# Patient Record
Sex: Female | Born: 1960 | Race: White | Hispanic: No | Marital: Married | Smoking: Never smoker
Health system: Southern US, Community
[De-identification: ages and names within clinical notes are randomized; demographics above are authoritative.]

## PROBLEM LIST (undated history)

## (undated) DIAGNOSIS — R000 Tachycardia, unspecified: Secondary | ICD-10-CM

## (undated) DIAGNOSIS — Z8249 Family history of ischemic heart disease and other diseases of the circulatory system: Secondary | ICD-10-CM

## (undated) DIAGNOSIS — R011 Cardiac murmur, unspecified: Secondary | ICD-10-CM

## (undated) DIAGNOSIS — E785 Hyperlipidemia, unspecified: Secondary | ICD-10-CM

## (undated) DIAGNOSIS — Z803 Family history of malignant neoplasm of breast: Secondary | ICD-10-CM

## (undated) DIAGNOSIS — R0789 Other chest pain: Secondary | ICD-10-CM

## (undated) DIAGNOSIS — Z780 Asymptomatic menopausal state: Secondary | ICD-10-CM

## (undated) DIAGNOSIS — Z8041 Family history of malignant neoplasm of ovary: Secondary | ICD-10-CM

## (undated) DIAGNOSIS — N912 Amenorrhea, unspecified: Secondary | ICD-10-CM

## (undated) DIAGNOSIS — M8580 Other specified disorders of bone density and structure, unspecified site: Secondary | ICD-10-CM

## (undated) HISTORY — DX: Other chest pain: R07.89

## (undated) HISTORY — DX: Asymptomatic menopausal state: Z78.0

## (undated) HISTORY — DX: Family history of malignant neoplasm of breast: Z80.3

## (undated) HISTORY — DX: Family history of ischemic heart disease and other diseases of the circulatory system: Z82.49

## (undated) HISTORY — DX: Family history of malignant neoplasm of ovary: Z80.41

## (undated) HISTORY — DX: Amenorrhea, unspecified: N91.2

## (undated) HISTORY — DX: Tachycardia, unspecified: R00.0

## (undated) HISTORY — DX: Cardiac murmur, unspecified: R01.1

## (undated) HISTORY — DX: Other specified disorders of bone density and structure, unspecified site: M85.80

## (undated) HISTORY — DX: Hyperlipidemia, unspecified: E78.5

---

## 1999-09-02 ENCOUNTER — Other Ambulatory Visit: Admission: RE | Admit: 1999-09-02 | Discharge: 1999-09-02 | Payer: Self-pay | Admitting: Obstetrics and Gynecology

## 1999-09-11 ENCOUNTER — Ambulatory Visit (HOSPITAL_COMMUNITY): Admission: RE | Admit: 1999-09-11 | Discharge: 1999-09-11 | Payer: Self-pay | Admitting: Obstetrics and Gynecology

## 2000-09-29 ENCOUNTER — Other Ambulatory Visit: Admission: RE | Admit: 2000-09-29 | Discharge: 2000-09-29 | Payer: Self-pay | Admitting: Obstetrics and Gynecology

## 2002-04-06 ENCOUNTER — Other Ambulatory Visit: Admission: RE | Admit: 2002-04-06 | Discharge: 2002-04-06 | Payer: Self-pay | Admitting: Obstetrics and Gynecology

## 2003-04-28 ENCOUNTER — Other Ambulatory Visit: Admission: RE | Admit: 2003-04-28 | Discharge: 2003-04-28 | Payer: Self-pay | Admitting: Obstetrics and Gynecology

## 2004-01-11 ENCOUNTER — Other Ambulatory Visit: Admission: RE | Admit: 2004-01-11 | Discharge: 2004-01-11 | Payer: Self-pay | Admitting: Obstetrics and Gynecology

## 2004-07-26 ENCOUNTER — Inpatient Hospital Stay (HOSPITAL_COMMUNITY): Admission: AD | Admit: 2004-07-26 | Discharge: 2004-08-01 | Payer: Self-pay | Admitting: Obstetrics and Gynecology

## 2004-09-03 ENCOUNTER — Other Ambulatory Visit: Admission: RE | Admit: 2004-09-03 | Discharge: 2004-09-03 | Payer: Self-pay | Admitting: Obstetrics and Gynecology

## 2005-12-03 ENCOUNTER — Other Ambulatory Visit: Admission: RE | Admit: 2005-12-03 | Discharge: 2005-12-03 | Payer: Self-pay | Admitting: Obstetrics and Gynecology

## 2012-11-11 ENCOUNTER — Ambulatory Visit: Payer: Self-pay | Admitting: Cardiovascular Disease

## 2012-12-15 ENCOUNTER — Ambulatory Visit: Payer: Self-pay | Admitting: Cardiovascular Disease

## 2012-12-31 ENCOUNTER — Ambulatory Visit: Payer: Self-pay | Admitting: Cardiovascular Disease

## 2013-01-14 ENCOUNTER — Ambulatory Visit: Payer: Self-pay | Admitting: Cardiovascular Disease

## 2013-01-31 ENCOUNTER — Encounter: Payer: Self-pay | Admitting: *Deleted

## 2013-02-02 ENCOUNTER — Ambulatory Visit: Payer: Self-pay | Admitting: Cardiovascular Disease

## 2013-03-03 ENCOUNTER — Ambulatory Visit (INDEPENDENT_AMBULATORY_CARE_PROVIDER_SITE_OTHER): Payer: Self-pay | Admitting: Cardiovascular Disease

## 2013-03-03 ENCOUNTER — Encounter: Payer: Self-pay | Admitting: *Deleted

## 2013-03-03 ENCOUNTER — Encounter: Payer: Self-pay | Admitting: Cardiovascular Disease

## 2013-03-03 VITALS — BP 122/83 | HR 75 | Ht 61.0 in | Wt 148.0 lb

## 2013-03-03 DIAGNOSIS — R000 Tachycardia, unspecified: Secondary | ICD-10-CM

## 2013-03-03 DIAGNOSIS — N912 Amenorrhea, unspecified: Secondary | ICD-10-CM | POA: Insufficient documentation

## 2013-03-03 DIAGNOSIS — R011 Cardiac murmur, unspecified: Secondary | ICD-10-CM | POA: Insufficient documentation

## 2013-03-03 DIAGNOSIS — E785 Hyperlipidemia, unspecified: Secondary | ICD-10-CM

## 2013-03-03 NOTE — Patient Instructions (Signed)
Your physician recommends that you schedule a follow-up appointment in: AS NEEDED  Your physician recommends that you continue on your current medications as directed. Please refer to the Current Medication list given to you today.  

## 2013-03-03 NOTE — Assessment & Plan Note (Signed)
Benign palpitations with no evidence of structural heart disease Observe

## 2013-03-03 NOTE — Progress Notes (Signed)
Patient ID: Wendy Shaw, female   DOB: 1961-10-26, 52 y.o.   MRN: [id number] 52 y o referred by Dr Arelia Sneddon for palpitations, murmur and elevated lipids. Family history of CAD.  Just started on statin for LDL 178 Labs reveiwed.  Palpitations at night infrequent.  Can get 1/week and then not at all for weeks. Short lived less than a minute. Usually resting watching tv. No ppt and no presyncope chest pain or dyspnea. Long standing history of benign murmur.  Last echo over 15 years ago ok.  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  All other systems reviewed and negative   General: Affect appropriate Healthy:  appears stated age HEENT: normal Neck supple with no adenopathy JVP normal no bruits no thyromegaly Lungs clear with no wheezing and good diaphragmatic motion Heart:  S1/S2 1/6 beingn SEM  murmur,rub, gallop or click PMI normal Abdomen: benighn, BS positve, no tenderness, no AAA no bruit.  No HSM or HJR Distal pulses intact with no bruits No edema Neuro non-focal Skin warm and dry No muscular weakness  Medications Current Outpatient Prescriptions  Medication Sig Dispense Refill  . CALCIUM-VITAMIN D PO Take 2 tablets by mouth daily.      . Multiple Vitamin (MULTIVITAMIN) capsule Take 1 capsule by mouth daily.      . Omega-3 Fatty Acids (FISH OIL PO) Take 2 tablets by mouth daily.       No current facility-administered medications for this visit.    Allergies Review of patient's allergies indicates no known allergies.  Family History: No family history on file.  Social History: History   Social History  . Marital Status: Single    Spouse Name: N/A    Number of Children: N/A  . Years  of Education: N/A   Occupational History  . Not on file.   Social History Main Topics  . Smoking status: Never Smoker   . Smokeless tobacco: Not on file  . Alcohol Use: Yes  . Drug Use: No  . Sexually Active: Not on file   Other Topics Concern  . Not on file   Social History Narrative  . No narrative on file    Electrocardiogram:  NSR rate 75 normal ECG  Assessment and Plan

## 2013-03-03 NOTE — Assessment & Plan Note (Signed)
Continue statin F/U labs with [facility]

## 2013-03-03 NOTE — Assessment & Plan Note (Signed)
Benign NO indication for echo or SBE

## 2017-07-17 DIAGNOSIS — E7800 Pure hypercholesterolemia, unspecified: Secondary | ICD-10-CM | POA: Diagnosis not present

## 2017-12-14 DIAGNOSIS — H5213 Myopia, bilateral: Secondary | ICD-10-CM | POA: Diagnosis not present

## 2017-12-24 DIAGNOSIS — Z8042 Family history of malignant neoplasm of prostate: Secondary | ICD-10-CM | POA: Diagnosis not present

## 2017-12-24 DIAGNOSIS — Z801 Family history of malignant neoplasm of trachea, bronchus and lung: Secondary | ICD-10-CM | POA: Diagnosis not present

## 2017-12-24 DIAGNOSIS — Z803 Family history of malignant neoplasm of breast: Secondary | ICD-10-CM | POA: Diagnosis not present

## 2017-12-24 DIAGNOSIS — Z01419 Encounter for gynecological examination (general) (routine) without abnormal findings: Secondary | ICD-10-CM | POA: Diagnosis not present

## 2018-01-26 DIAGNOSIS — H40012 Open angle with borderline findings, low risk, left eye: Secondary | ICD-10-CM | POA: Diagnosis not present

## 2018-01-26 DIAGNOSIS — H5213 Myopia, bilateral: Secondary | ICD-10-CM | POA: Diagnosis not present

## 2018-01-26 DIAGNOSIS — H524 Presbyopia: Secondary | ICD-10-CM | POA: Diagnosis not present

## 2018-02-01 ENCOUNTER — Encounter: Payer: Self-pay | Admitting: Nurse Practitioner

## 2018-02-01 ENCOUNTER — Ambulatory Visit: Payer: 59 | Admitting: Nurse Practitioner

## 2018-02-01 VITALS — BP 142/88 | HR 75 | Temp 97.8°F | Ht 61.0 in | Wt 152.0 lb

## 2018-02-01 DIAGNOSIS — Z1159 Encounter for screening for other viral diseases: Secondary | ICD-10-CM | POA: Diagnosis not present

## 2018-02-01 DIAGNOSIS — Z87898 Personal history of other specified conditions: Secondary | ICD-10-CM

## 2018-02-01 DIAGNOSIS — E782 Mixed hyperlipidemia: Secondary | ICD-10-CM | POA: Diagnosis not present

## 2018-02-01 DIAGNOSIS — Z0001 Encounter for general adult medical examination with abnormal findings: Secondary | ICD-10-CM

## 2018-02-01 MED ORDER — MECLIZINE HCL 12.5 MG PO TABS: 12.5000 mg | ORAL_TABLET | Freq: Two times a day (BID) | ORAL | 0 refills | Status: DC | PRN

## 2018-02-01 NOTE — Patient Instructions (Addendum)
Please call your insurance about hepatitis c and cologuard coverage.  Let me know through mychart if you want to proceed with hepatitis C and cologuard orders.  Return to lab for blood draw (need to be fasting at least 6-8hrs prior to blood).  Use meclizine as needed for vertigo. Do not drive or operate heavy machinery if have vertigo and need medication.  Check BP at home or retail pharmacy 2-3times a week. Call office if BP persistent >150/80.  Encourage regular exercise and heart healthy diet.  Health Maintenance, Female Adopting a healthy lifestyle and getting preventive care can go a long way to promote health and wellness. Talk with your health care provider about what schedule of regular examinations is right for you. This is a good chance for you to check in with your provider about disease prevention and staying healthy. In between checkups, there are plenty of things you can do on your own. Experts have done a lot of research about which lifestyle changes and preventive measures are most likely to keep you healthy. Ask your health care provider for more information. Weight and diet Eat a healthy diet  Be sure to include plenty of vegetables, fruits, low-fat dairy products, and lean protein.  Do not eat a lot of foods high in solid fats, added sugars, or salt.  Get regular exercise. This is one of the most important things you can do for your health. ? Most adults should exercise for at least 150 minutes each week. The exercise should increase your heart rate and make you sweat (moderate-intensity exercise). ? Most adults should also do strengthening exercises at least twice a week. This is in addition to the moderate-intensity exercise.  Maintain a healthy weight  Body mass index (BMI) is a measurement that can be used to identify possible weight problems. It estimates body fat based on height and weight. Your health care provider can help determine your BMI and help you  achieve or maintain a healthy weight.  For females 70 years of age and older: ? A BMI below 18.5 is considered underweight. ? A BMI of 18.5 to 24.9 is normal. ? A BMI of 25 to 29.9 is considered overweight. ? A BMI of 30 and above is considered obese.  Watch levels of cholesterol and blood lipids  You should start having your blood tested for lipids and cholesterol at 56 years of age, then have this test every 5 years.  You may need to have your cholesterol levels checked more often if: ? Your lipid or cholesterol levels are high. ? You are older than 56 years of age. ? You are at high risk for heart disease.  Cancer screening Lung Cancer  Lung cancer screening is recommended for adults 60-52 years old who are at high risk for lung cancer because of a history of smoking.  A yearly low-dose CT scan of the lungs is recommended for people who: ? Currently smoke. ? Have quit within the past 15 years. ? Have at least a 30-pack-year history of smoking. A pack year is smoking an average of one pack of cigarettes a day for 1 year.  Yearly screening should continue until it has been 15 years since you quit.  Yearly screening should stop if you develop a health problem that would prevent you from having lung cancer treatment.  Breast Cancer  Practice breast self-awareness. This means understanding how your breasts normally appear and feel.  It also means doing regular breast self-exams. Let your  health care provider know about any changes, no matter how small.  If you are in your 20s or 30s, you should have a clinical breast exam (CBE) by a health care provider every 1-3 years as part of a regular health exam.  If you are 75 or older, have a CBE every year. Also consider having a breast X-ray (mammogram) every year.  If you have a family history of breast cancer, talk to your health care provider about genetic screening.  If you are at high risk for breast cancer, talk to your health  care provider about having an MRI and a mammogram every year.  Breast cancer gene (BRCA) assessment is recommended for women who have family members with BRCA-related cancers. BRCA-related cancers include: ? Breast. ? Ovarian. ? Tubal. ? Peritoneal cancers.  Results of the assessment will determine the need for genetic counseling and BRCA1 and BRCA2 testing.  Cervical Cancer Your health care provider may recommend that you be screened regularly for cancer of the pelvic organs (ovaries, uterus, and vagina). This screening involves a pelvic examination, including checking for microscopic changes to the surface of your cervix (Pap test). You may be encouraged to have this screening done every 3 years, beginning at age 66.  For women ages 39-65, health care providers may recommend pelvic exams and Pap testing every 3 years, or they may recommend the Pap and pelvic exam, combined with testing for human papilloma virus (HPV), every 5 years. Some types of HPV increase your risk of cervical cancer. Testing for HPV may also be done on women of any age with unclear Pap test results.  Other health care providers may not recommend any screening for nonpregnant women who are considered low risk for pelvic cancer and who do not have symptoms. Ask your health care provider if a screening pelvic exam is right for you.  If you have had past treatment for cervical cancer or a condition that could lead to cancer, you need Pap tests and screening for cancer for at least 20 years after your treatment. If Pap tests have been discontinued, your risk factors (such as having a new sexual partner) need to be reassessed to determine if screening should resume. Some women have medical problems that increase the chance of getting cervical cancer. In these cases, your health care provider may recommend more frequent screening and Pap tests.  Colorectal Cancer  This type of cancer can be detected and often  prevented.  Routine colorectal cancer screening usually begins at 56 years of age and continues through 56 years of age.  Your health care provider may recommend screening at an earlier age if you have risk factors for colon cancer.  Your health care provider may also recommend using home test kits to check for hidden blood in the stool.  A small camera at the end of a tube can be used to examine your colon directly (sigmoidoscopy or colonoscopy). This is done to check for the earliest forms of colorectal cancer.  Routine screening usually begins at age 75.  Direct examination of the colon should be repeated every 5-10 years through 56 years of age. However, you may need to be screened more often if early forms of precancerous polyps or small growths are found.  Skin Cancer  Check your skin from head to toe regularly.  Tell your health care provider about any new moles or changes in moles, especially if there is a change in a mole's shape or color.  Also  tell your health care provider if you have a mole that is larger than the size of a pencil eraser.  Always use sunscreen. Apply sunscreen liberally and repeatedly throughout the day.  Protect yourself by wearing long sleeves, pants, a wide-brimmed hat, and sunglasses whenever you are outside.  Heart disease, diabetes, and high blood pressure  High blood pressure causes heart disease and increases the risk of stroke. High blood pressure is more likely to develop in: ? People who have blood pressure in the high end of the normal range (130-139/85-89 mm Hg). ? People who are overweight or obese. ? People who are African American.  If you are 70-88 years of age, have your blood pressure checked every 3-5 years. If you are 70 years of age or older, have your blood pressure checked every year. You should have your blood pressure measured twice-once when you are at a hospital or clinic, and once when you are not at a hospital or clinic.  Record the average of the two measurements. To check your blood pressure when you are not at a hospital or clinic, you can use: ? An automated blood pressure machine at a pharmacy. ? A home blood pressure monitor.  If you are between 31 years and 20 years old, ask your health care provider if you should take aspirin to prevent strokes.  Have regular diabetes screenings. This involves taking a blood sample to check your fasting blood sugar level. ? If you are at a normal weight and have a low risk for diabetes, have this test once every three years after 56 years of age. ? If you are overweight and have a high risk for diabetes, consider being tested at a younger age or more often. Preventing infection Hepatitis B  If you have a higher risk for hepatitis B, you should be screened for this virus. You are considered at high risk for hepatitis B if: ? You were born in a country where hepatitis B is common. Ask your health care provider which countries are considered high risk. ? Your parents were born in a high-risk country, and you have not been immunized against hepatitis B (hepatitis B vaccine). ? You have HIV or AIDS. ? You use needles to inject street drugs. ? You live with someone who has hepatitis B. ? You have had sex with someone who has hepatitis B. ? You get hemodialysis treatment. ? You take certain medicines for conditions, including cancer, organ transplantation, and autoimmune conditions.  Hepatitis C  Blood testing is recommended for: ? Everyone born from [birth year] through 1965. ? Anyone with known risk factors for hepatitis C.  Sexually transmitted infections (STIs)  You should be screened for sexually transmitted infections (STIs) including gonorrhea and chlamydia if: ? You are sexually active and are younger than 56 years of age. ? You are older than 56 years of age and your health care provider tells you that you are at risk for this type of infection. ? Your sexual  activity has changed since you were last screened and you are at an increased risk for chlamydia or gonorrhea. Ask your health care provider if you are at risk.  If you do not have HIV, but are at risk, it may be recommended that you take a prescription medicine daily to prevent HIV infection. This is called pre-exposure prophylaxis (PrEP). You are considered at risk if: ? You are sexually active and do not regularly use condoms or know the HIV status of your  partner(s). ? You take drugs by injection. ? You are sexually active with a partner who has HIV.  Talk with your health care provider about whether you are at high risk of being infected with HIV. If you choose to begin PrEP, you should first be tested for HIV. You should then be tested every 3 months for as long as you are taking PrEP. Pregnancy  If you are premenopausal and you may become pregnant, ask your health care provider about preconception counseling.  If you may become pregnant, take 400 to 800 micrograms (mcg) of folic acid every day.  If you want to prevent pregnancy, talk to your health care provider about birth control (contraception). Osteoporosis and menopause  Osteoporosis is a disease in which the bones lose minerals and strength with aging. This can result in serious bone fractures. Your risk for osteoporosis can be identified using a bone density scan.  If you are 100 years of age or older, or if you are at risk for osteoporosis and fractures, ask your health care provider if you should be screened.  Ask your health care provider whether you should take a calcium or vitamin D supplement to lower your risk for osteoporosis.  Menopause may have certain physical symptoms and risks.  Hormone replacement therapy may reduce some of these symptoms and risks. Talk to your health care provider about whether hormone replacement therapy is right for you. Follow these instructions at home:  Schedule regular health, dental,  and eye exams.  Stay current with your immunizations.  Do not use any tobacco products including cigarettes, chewing tobacco, or electronic cigarettes.  If you are pregnant, do not drink alcohol.  If you are breastfeeding, limit how much and how often you drink alcohol.  Limit alcohol intake to no more than 1 drink per day for nonpregnant women. One drink equals 12 ounces of beer, 5 ounces of wine, or 1 ounces of hard liquor.  Do not use street drugs.  Do not share needles.  Ask your health care provider for help if you need support or information about quitting drugs.  Tell your health care provider if you often feel depressed.  Tell your health care provider if you have ever been abused or do not feel safe at home. This information is not intended to replace advice given to you by your health care provider. Make sure you discuss any questions you have with your health care provider. Document Released: 06/09/2011 Document Revised: 05/01/2016 Document Reviewed: 08/28/2015 Elsevier Interactive Patient Education  [employer].

## 2018-02-01 NOTE — Progress Notes (Signed)
Subjective:    Patient ID: Wendy Shaw, female    DOB: 09/09/1961, 57 y.o.   MRN: [id number]  Patient presents today for complete physical  Dizziness  This is a chronic problem. The current episode started more than 1 year ago. The problem occurs intermittently. The problem has been unchanged. Associated symptoms include vertigo. Pertinent negatives include no chest pain, congestion, coughing, fever, headaches, myalgias, neck pain, numbness, rash, sore throat, visual change or weakness. The symptoms are aggravated by twisting. She has tried rest for the symptoms. The treatment provided significant relief.   Hx or Vertigo" Intermittent,  Describes as spinning and unsteady gait. Will like meclizine Rx  Previous pcp with [facility]: Dr. Zachery DauerBarnes. Last seen 07/2017.  Immunizations: (TDAP, Hep C screen, Pneumovax, Influenza, zoster)  Health Maintenance  Topic Date Due  .  Hepatitis C: One time screening is recommended by Center for Disease Control  (CDC) for  adults born from [birth year] through 1965.   September 20, 1961  . Tetanus Vaccine  12/09/1980  . Pap Smear  12/09/1982  . Mammogram  12/10/2011  . Colon Cancer Screening  12/10/2011  . Flu Shot  10/04/2018*  . HIV Screening  02/01/2019*  *Topic was postponed. The date shown is not the original due date.   Diet:regular.  Weight:  Wt Readings from Last 3 Encounters:  02/01/18 152 lb (68.9 kg)  03/03/13 148 lb (67.1 kg)    Exercise:none.  Fall Risk: Fall Risk  02/01/2018  Falls in the past year? No   Home Safety:home with husband.  Depression/Suicide: Depression screen [facility] 2/9 02/01/2018  Decreased Interest 0  Down, Depressed, Hopeless 0  PHQ - 2 Score 0   No flowsheet data found. Colonoscopy (every 5-3548yrs, >50-29328yrs):needed  Pap Smear (every 4098yrs for >21-29 without HPV, every 3328yrs for >30-81628yrs with HPV):Dr. Mccomb [facility]. Last seen 12/2017 (normal PAP), no hx of abnormal.  Mammogram (yearly,  >12428yrs):last done 12/2017 with normal results per patient.  Vision:up to date.  Dental:up to date.  Medications and allergies reviewed with patient and updated if appropriate.  Patient Active Problem List   Diagnosis Date Noted  . Hyperlipidemia 03/03/2013  . Heart murmur   . Tachycardia   . Amenorrhea     Current Outpatient Medications on File Prior to Visit  Medication Sig Dispense Refill  . CALCIUM-VITAMIN D PO Take 1,000 mg by mouth daily. 2 tab daily,Calcium, magnesium & Zinc    . Glucosamine Sulfate 1000 MG TABS Take 1,000 mg by mouth daily at 2 PM. 2 tab daily    . Multiple Vitamin (MULTIVITAMIN) capsule Take 1 capsule by mouth daily.    . Omega-3 Fatty Acids (FISH OIL PO) Take 1,000 mg by mouth daily at 2 PM. 2 tab daily    . rosuvastatin (CRESTOR) 5 MG tablet TK 1 T PO QD  0  . Specialty Vitamins Products (BIOTIN PLUS KERATIN PO) Take 100 mg by mouth daily at 2 PM.      No current facility-administered medications on file prior to visit.     Past Medical History:  Diagnosis Date  . Amenorrhea    secondary to menopause  . Heart murmur   . Hyperlipidemia   . Tachycardia     Past Surgical History:  Procedure Laterality Date  . CESAREAN SECTION  2005    Social History   Socioeconomic History  . Marital status: Single    Spouse name: None  . Number of children: None  . Years  of education: None  . Highest education level: None  Social Needs  . Financial resource strain: None  . Food insecurity - worry: None  . Food insecurity - inability: None  . Transportation needs - medical: None  . Transportation needs - non-medical: None  Occupational History  . None  Tobacco Use  . Smoking status: Never Smoker  . Smokeless tobacco: Never Used  Substance and Sexual Activity  . Alcohol use: Yes    Comment: social  . Drug use: No  . Sexual activity: None  Other Topics Concern  . None  Social History Narrative  . None    Family History  Problem Relation  Age of Onset  . Hyperlipidemia Mother   . Arthritis Mother   . Cancer Father        prostate cancer  . Arthritis Father   . Hypertension Father   . Heart attack Father   . Cancer Maternal Grandfather        lung cancer        Review of Systems  Constitutional: Negative for fever, malaise/fatigue and weight loss.  HENT: Negative for congestion and sore throat.   Eyes:       Negative for visual changes  Respiratory: Negative for cough and shortness of breath.   Cardiovascular: Negative for chest pain, palpitations and leg swelling.  Gastrointestinal: Negative for blood in stool, constipation, diarrhea and heartburn.  Genitourinary: Negative for dysuria, frequency and urgency.  Musculoskeletal: Negative for falls, joint pain, myalgias and neck pain.  Skin: Negative for rash.  Neurological: Positive for dizziness and vertigo. Negative for sensory change, weakness, numbness and headaches.  Endo/Heme/Allergies: Does not bruise/bleed easily.  Psychiatric/Behavioral: Negative for depression, substance abuse and suicidal ideas. The patient is not nervous/anxious.     Objective:   Vitals:   02/01/18 0914  BP: (!) 142/88  Pulse: 75  Temp: 97.8 F (36.6 C)  SpO2: 97%    Body mass index is 28.72 kg/m.   Physical Examination:  Physical Exam  Constitutional: She is oriented to person, place, and time and well-developed, well-nourished, and in no distress. No distress.  HENT:  Right Ear: External ear normal.  Left Ear: External ear normal.  Nose: Nose normal.  Mouth/Throat: Oropharynx is clear and moist. No oropharyngeal exudate.  Eyes: Conjunctivae and EOM are normal. Pupils are equal, round, and reactive to light. No scleral icterus.  Neck: Normal range of motion. Neck supple. No thyromegaly present.  Cardiovascular: Normal rate, normal heart sounds and intact distal pulses.  Pulmonary/Chest: Effort normal and breath sounds normal. She exhibits no tenderness.  Abdominal:  Soft. Bowel sounds are normal. She exhibits no distension. There is no tenderness.  Genitourinary:  Genitourinary Comments: Pelvic and breast exam deferred to GYN per patient  Musculoskeletal: Normal range of motion. She exhibits no edema or tenderness.  Lymphadenopathy:    She has no cervical adenopathy.  Neurological: She is alert and oriented to person, place, and time. Gait normal.  Skin: Skin is warm and dry.  Psychiatric: Affect and judgment normal.    ASSESSMENT and PLAN:  Cordelia Pen was seen today for establish care.  Diagnoses and all orders for this visit:  Encounter for preventative adult health care exam with abnormal findings -     CBC; Future -     Comprehensive metabolic panel; Future -     Hepatitis C Antibody; Future  Mixed hyperlipidemia -     Lipid panel; Future  Encounter for hepatitis C screening test  for low risk patient -     Hepatitis C Antibody; Future  Hx of vertigo -     meclizine (ANTIVERT) 12.5 MG tablet; Take 1 tablet (12.5 mg total) by mouth 2 (two) times daily as needed for dizziness (do not use for more than 3days).   No problem-specific Assessment & Plan notes found for this encounter.     Follow up: Return in about 6 months (around 08/01/2018) for hyperlipidemia (fasting).  Alysia Penna, NP

## 2018-02-03 ENCOUNTER — Encounter: Payer: Self-pay | Admitting: Nurse Practitioner

## 2018-02-04 ENCOUNTER — Telehealth: Payer: Self-pay | Admitting: Nurse Practitioner

## 2018-02-04 NOTE — Telephone Encounter (Signed)
Hep C test canceled.

## 2018-02-04 NOTE — Addendum Note (Signed)
Addended by: Marcell AngerSELF, Nasirah Sachs E on: 02/04/2018 04:24 PM   Modules accepted: Orders

## 2018-02-04 NOTE — Telephone Encounter (Signed)
Copied from CRM [phone]. Topic: Quick Communication - See Telephone Encounter >> Feb 04, 2018  4:19 PM Trula SladeWalter, Linda F wrote: CRM for notification. See Telephone encounter for:  02/04/18. Patient is having blood work done tomorrow 02/05/18, but she does not want the Hep C test.  Just do the standard blood test.

## 2018-02-05 ENCOUNTER — Telehealth: Payer: Self-pay | Admitting: Nurse Practitioner

## 2018-02-05 ENCOUNTER — Other Ambulatory Visit (INDEPENDENT_AMBULATORY_CARE_PROVIDER_SITE_OTHER): Payer: 59

## 2018-02-05 DIAGNOSIS — E782 Mixed hyperlipidemia: Secondary | ICD-10-CM

## 2018-02-05 DIAGNOSIS — Z0001 Encounter for general adult medical examination with abnormal findings: Secondary | ICD-10-CM | POA: Diagnosis not present

## 2018-02-05 LAB — COMPREHENSIVE METABOLIC PANEL
ALT: 17 U/L (ref 0–35)
AST: 16 U/L (ref 0–37)
Albumin: 3.9 g/dL (ref 3.5–5.2)
Alkaline Phosphatase: 83 U/L (ref 39–117)
BILIRUBIN TOTAL: 0.5 mg/dL (ref 0.2–1.2)
BUN: 12 mg/dL (ref 6–23)
CHLORIDE: 105 meq/L (ref 96–112)
CO2: 32 mEq/L (ref 19–32)
CREATININE: 0.47 mg/dL (ref 0.40–1.20)
Calcium: 9.5 mg/dL (ref 8.4–10.5)
GFR: 145.61 mL/min (ref >60.00)
Glucose, Bld: 98 mg/dL (ref 70–99)
Potassium: 4.8 mEq/L (ref 3.5–5.1)
Sodium: 145 mEq/L (ref 135–145)
Total Protein: 7.2 g/dL (ref 6.0–8.3)

## 2018-02-05 LAB — LIPID PANEL
CHOLESTEROL: 236 mg/dL — AB (ref 0–200)
HDL: 81 mg/dL (ref >39.00)
LDL CALC: 144 mg/dL — AB (ref 0–99)
NonHDL: 154.94
TRIGLYCERIDES: 53 mg/dL (ref 0.0–149.0)
Total CHOL/HDL Ratio: 3
VLDL: 10.6 mg/dL (ref 0.0–40.0)

## 2018-02-05 LAB — CBC
HEMATOCRIT: 41.9 % (ref 36.0–46.0)
Hemoglobin: 13.9 g/dL (ref 12.0–15.0)
MCHC: 33.1 g/dL (ref 30.0–36.0)
MCV: 89 fl (ref 78.0–100.0)
Platelets: 289 10*3/uL (ref 150.0–400.0)
RBC: 4.71 Mil/uL (ref 3.87–5.11)
RDW: 13.3 % (ref 11.5–15.5)
WBC: 6.2 10*3/uL (ref 4.0–10.5)

## 2018-02-05 MED ORDER — ROSUVASTATIN CALCIUM 5 MG PO TABS: ORAL_TABLET | ORAL | 1 refills | Status: DC

## 2018-02-05 NOTE — Telephone Encounter (Signed)
Left detail massage inform the pt we sent in rx as requested.

## 2018-02-05 NOTE — Telephone Encounter (Signed)
Ok to sent 90tabs with 1refill

## 2018-02-05 NOTE — Telephone Encounter (Signed)
Please advise, lab is not result yet.

## 2018-02-05 NOTE — Telephone Encounter (Signed)
-----   Message from Varney BilesAngela M Wiesner, RT sent at 02/05/2018  9:17 AM EST ----- Regarding: Cholesterol med Pt just came in for labs. She wants to know if her prescription for Crestor [facility]. She will be out by this Sunday. Thanks

## 2018-02-10 ENCOUNTER — Encounter: Payer: Self-pay | Admitting: Nurse Practitioner

## 2018-05-17 DIAGNOSIS — Z1212 Encounter for screening for malignant neoplasm of rectum: Secondary | ICD-10-CM | POA: Diagnosis not present

## 2018-05-17 DIAGNOSIS — Z1211 Encounter for screening for malignant neoplasm of colon: Secondary | ICD-10-CM | POA: Diagnosis not present

## 2018-05-18 LAB — COLOGUARD: Cologuard: NEGATIVE

## 2018-07-18 ENCOUNTER — Other Ambulatory Visit: Payer: Self-pay | Admitting: Nurse Practitioner

## 2019-02-09 DIAGNOSIS — Z6830 Body mass index (BMI) 30.0-30.9, adult: Secondary | ICD-10-CM | POA: Diagnosis not present

## 2019-02-09 DIAGNOSIS — Z01419 Encounter for gynecological examination (general) (routine) without abnormal findings: Secondary | ICD-10-CM | POA: Diagnosis not present

## 2019-02-25 ENCOUNTER — Telehealth: Payer: Self-pay | Admitting: Nurse Practitioner

## 2019-02-25 NOTE — Telephone Encounter (Signed)
Copied from CRM [id number]. Topic: Quick Communication - Rx Refill/Question >> Feb 25, 2019  3:48 PM Jaquita Rector A wrote: Medication: rosuvastatin (CRESTOR) 5 MG tablet [  Has the patient contacted their pharmacy? Yes.   (Agent: If no, request that the patient contact the pharmacy for the refill.) (Agent: If yes, when and what did the pharmacy advise?)  Preferred Pharmacy (with phone number or street name): [id number] - [place], [place] - [address] AT [facility] [address] [phone] (Phone) [phone] (Fax)    Agent: Please be advised that RX refills may take up to 3 business days. We ask that you follow-up with your pharmacy.

## 2019-02-28 MED ORDER — ROSUVASTATIN CALCIUM 5 MG PO TABS: ORAL_TABLET | ORAL | 1 refills | Status: DC

## 2019-02-28 NOTE — Telephone Encounter (Signed)
Rx sent,left detail massage inform the pt and remind the pt to make 6 mo f/u with [facility].

## 2019-03-25 ENCOUNTER — Telehealth: Payer: Self-pay | Admitting: Nurse Practitioner

## 2019-03-25 NOTE — Telephone Encounter (Signed)
Called and left vm for patient. Calling to schedule virtual visit with [place].  °

## 2019-08-25 ENCOUNTER — Ambulatory Visit: Payer: 59 | Admitting: Nurse Practitioner

## 2019-10-10 ENCOUNTER — Ambulatory Visit (INDEPENDENT_AMBULATORY_CARE_PROVIDER_SITE_OTHER): Payer: 59 | Admitting: Nurse Practitioner

## 2019-10-10 ENCOUNTER — Other Ambulatory Visit: Payer: Self-pay

## 2019-10-10 ENCOUNTER — Encounter: Payer: Self-pay | Admitting: Nurse Practitioner

## 2019-10-10 VITALS — BP 136/86 | HR 74 | Temp 97.6°F | Ht 61.0 in | Wt 161.2 lb

## 2019-10-10 DIAGNOSIS — E782 Mixed hyperlipidemia: Secondary | ICD-10-CM

## 2019-10-10 DIAGNOSIS — Z114 Encounter for screening for human immunodeficiency virus [HIV]: Secondary | ICD-10-CM

## 2019-10-10 DIAGNOSIS — Z0000 Encounter for general adult medical examination without abnormal findings: Secondary | ICD-10-CM | POA: Diagnosis not present

## 2019-10-10 DIAGNOSIS — Z1159 Encounter for screening for other viral diseases: Secondary | ICD-10-CM | POA: Diagnosis not present

## 2019-10-10 DIAGNOSIS — Z23 Encounter for immunization: Secondary | ICD-10-CM

## 2019-10-10 LAB — TSH: TSH: 1.41 u[IU]/mL (ref 0.35–4.50)

## 2019-10-10 LAB — CBC
HCT: 41.9 % (ref 36.0–46.0)
Hemoglobin: 13.7 g/dL (ref 12.0–15.0)
MCHC: 32.7 g/dL (ref 30.0–36.0)
MCV: 89.6 fl (ref 78.0–100.0)
Platelets: 299 10*3/uL (ref 150.0–400.0)
RBC: 4.68 Mil/uL (ref 3.87–5.11)
RDW: 13.9 % (ref 11.5–15.5)
WBC: 5.3 10*3/uL (ref 4.0–10.5)

## 2019-10-10 LAB — COMPREHENSIVE METABOLIC PANEL
ALT: 16 U/L (ref 0–35)
AST: 16 U/L (ref 0–37)
Albumin: 4.1 g/dL (ref 3.5–5.2)
Alkaline Phosphatase: 88 U/L (ref 39–117)
BUN: 14 mg/dL (ref 6–23)
CO2: 30 mEq/L (ref 19–32)
Calcium: 9 mg/dL (ref 8.4–10.5)
Chloride: 104 mEq/L (ref 96–112)
Creatinine, Ser: 0.5 mg/dL (ref 0.40–1.20)
GFR: 126.8 mL/min (ref >60.00)
Glucose, Bld: 91 mg/dL (ref 70–99)
Potassium: 4.1 mEq/L (ref 3.5–5.1)
Sodium: 142 mEq/L (ref 135–145)
Total Bilirubin: 0.5 mg/dL (ref 0.2–1.2)
Total Protein: 6.8 g/dL (ref 6.0–8.3)

## 2019-10-10 LAB — LIPID PANEL
Cholesterol: 233 mg/dL — ABNORMAL HIGH (ref 0–200)
HDL: 72.7 mg/dL (ref >39.00)
LDL Cholesterol: 148 mg/dL — ABNORMAL HIGH (ref 0–99)
NonHDL: 160.39
Total CHOL/HDL Ratio: 3
Triglycerides: 63 mg/dL (ref 0.0–149.0)
VLDL: 12.6 mg/dL (ref 0.0–40.0)

## 2019-10-10 NOTE — Patient Instructions (Addendum)
Sign medical release to get records from [facility] (mammogram, PAP, and cologuard results)  Negative Hep.C and HIV Normal TSH, CMP, and CBC Abnormal lipid panel: elevated TC and LDL. increased crestor to 10mg . Also maintain DASH diet and regular exercise. F/up in 36months fo repeat lipid panel (fasting)  Return to office in 10months for second dose of Shingrix   Health Maintenance, Female Adopting a healthy lifestyle and getting preventive care are important in promoting health and wellness. Ask your health care provider about:  The right schedule for you to have regular tests and exams.  Things you can do on your own to prevent diseases and keep yourself healthy. What should I know about diet, weight, and exercise? Eat a healthy diet   Eat a diet that includes plenty of vegetables, fruits, low-fat dairy products, and lean protein.  Do not eat a lot of foods that are high in solid fats, added sugars, or sodium. Maintain a healthy weight Body mass index (BMI) is used to identify weight problems. It estimates body fat based on height and weight. Your health care provider can help determine your BMI and help you achieve or maintain a healthy weight. Get regular exercise Get regular exercise. This is one of the most important things you can do for your health. Most adults should:  Exercise for at least 150 minutes each week. The exercise should increase your heart rate and make you sweat (moderate-intensity exercise).  Do strengthening exercises at least twice a week. This is in addition to the moderate-intensity exercise.  Spend less time sitting. Even light physical activity can be beneficial. Watch cholesterol and blood lipids Have your blood tested for lipids and cholesterol at 57 years of age, then have this test every 5 years. Have your cholesterol levels checked more often if:  Your lipid or cholesterol levels are high.  You are older than 57 years of age.  You are  at high risk for heart disease. What should I know about cancer screening? Depending on your health history and family history, you may need to have cancer screening at various ages. This may include screening for:  Breast cancer.  Cervical cancer.  Colorectal cancer.  Skin cancer.  Lung cancer. What should I know about heart disease, diabetes, and high blood pressure? Blood pressure and heart disease  High blood pressure causes heart disease and increases the risk of stroke. This is more likely to develop in people who have high blood pressure readings, are of African descent, or are overweight.  Have your blood pressure checked: ? Every 3-5 years if you are 17-40 years of age. ? Every year if you are 76 years old or older. Diabetes Have regular diabetes screenings. This checks your fasting blood sugar level. Have the screening done:  Once every three years after age 27 if you are at a normal weight and have a low risk for diabetes.  More often and at a younger age if you are overweight or have a high risk for diabetes. What should I know about preventing infection? Hepatitis B If you have a higher risk for hepatitis B, you should be screened for this virus. Talk with your health care provider to find out if you are at risk for hepatitis B infection. Hepatitis C Testing is recommended for:  Everyone born from [birth year] through 1965.  Anyone with known risk factors for hepatitis C. Sexually transmitted infections (STIs)  Get screened for STIs, including gonorrhea and chlamydia, if: ? You  are sexually active and are younger than 57 years of age. ? You are older than 57 years of age and your health care provider tells you that you are at risk for this type of infection. ? Your sexual activity has changed since you were last screened, and you are at increased risk for chlamydia or gonorrhea. Ask your health care provider if you are at risk.  Ask your health care provider about  whether you are at high risk for HIV. Your health care provider may recommend a prescription medicine to help prevent HIV infection. If you choose to take medicine to prevent HIV, you should first get tested for HIV. You should then be tested every 3 months for as long as you are taking the medicine. Pregnancy  If you are about to stop having your period (premenopausal) and you may become pregnant, seek counseling before you get pregnant.  Take 400 to 800 micrograms (mcg) of folic acid every day if you become pregnant.  Ask for birth control (contraception) if you want to prevent pregnancy. Osteoporosis and menopause Osteoporosis is a disease in which the bones lose minerals and strength with aging. This can result in bone fractures. If you are 13 years old or older, or if you are at risk for osteoporosis and fractures, ask your health care provider if you should:  Be screened for bone loss.  Take a calcium or vitamin D supplement to lower your risk of fractures.  Be given hormone replacement therapy (HRT) to treat symptoms of menopause. Follow these instructions at home: Lifestyle  Do not use any products that contain nicotine or tobacco, such as cigarettes, e-cigarettes, and chewing tobacco. If you need help quitting, ask your health care provider.  Do not use street drugs.  Do not share needles.  Ask your health care provider for help if you need support or information about quitting drugs. Alcohol use  Do not drink alcohol if: ? Your health care provider tells you not to drink. ? You are pregnant, may be pregnant, or are planning to become pregnant.  If you drink alcohol: ? Limit how much you use to 0-1 drink a day. ? Limit intake if you are breastfeeding.  Be aware of how much alcohol is in your drink. In the [place] one drink equals one 12 oz bottle of beer (355 mL), one 5 oz glass of wine (148 mL), or one 1 oz glass of hard liquor (44 mL). General instructions  Schedule  regular health, dental, and eye exams.  Stay current with your vaccines.  Tell your health care provider if: ? You often feel depressed. ? You have ever been abused or do not feel safe at home. Summary  Adopting a healthy lifestyle and getting preventive care are important in promoting health and wellness.  Follow your health care provider's instructions about healthy diet, exercising, and getting tested or screened for diseases.  Follow your health care provider's instructions on monitoring your cholesterol and blood pressure. This information is not intended to replace advice given to you by your health care provider. Make sure you discuss any questions you have with your health care provider. Document Released: 06/09/2011 Document Revised: 11/17/2018 Document Reviewed: 11/17/2018 Elsevier Patient Education  2020 [employer].

## 2019-10-10 NOTE — Progress Notes (Addendum)
Subjective:    Patient ID: Wendy Shaw, female    DOB: 07/29/1961, 58 y.o.   MRN: [id number]  Patient presents today for complete physical   HPI  Sexual History (orientation,birth control, marital status, STD):PAP, mammogram, and breast exam [facility] by [facility].  Depression/Suicide: [facility] 10/10/2019 02/01/2018  Decreased Interest 0 0  Down, Depressed, Hopeless 0 0  PHQ - 2 Score 0 0   Vision:up to date  Dental:up to date, cleaning at least every 6months  Immunizations: (TDAP, Hep C screen, Pneumovax, Influenza, zoster): cologuard completed 2019 per patient: normal results Health Maintenance  Topic Date Due  .  Hepatitis C: One time screening is recommended by Center for Disease Control  (CDC) for  adults born from [birth year] through 1964.   [birth year]  . HIV Screening  12/09/1976  . Tetanus Vaccine  12/09/1980  . Pap Smear  12/09/1982  . Mammogram  12/10/2011  . Colon Cancer Screening  12/10/2011  . Flu Shot  Completed   Diet:regular.   Weight:  Wt Readings from Last 3 Encounters:  10/10/19 161 lb 3.2 oz (73.1 kg)  02/01/18 152 lb (68.9 kg)  03/03/13 148 lb (67.1 kg)    Exercise:none  Fall Risk: Fall Risk  10/10/2019 02/01/2018  Falls in the past year? 0 No   Medications and allergies reviewed with patient and updated if appropriate.  Patient Active Problem List   Diagnosis Date Noted  . Hyperlipidemia 03/03/2013  . Heart murmur   . Tachycardia   . Amenorrhea     Current Outpatient Medications on File Prior to Visit  Medication Sig Dispense Refill  . CALCIUM-VITAMIN D PO Take 1,000 mg by mouth daily. 2 tab daily,Calcium, magnesium & Zinc    . Glucosamine Sulfate 1000 MG TABS Take 1,000 mg by mouth daily at 2 PM. 2 tab daily    . IBUPROFEN PO Take by mouth.    . meclizine (ANTIVERT) 12.5 MG tablet Take 1 tablet (12.5 mg total) by mouth 2 (two) times daily as needed for dizziness (do not use for more than 3days). 6 tablet 0  . Multiple  Vitamin (MULTIVITAMIN) capsule Take 1 capsule by mouth daily.    Marland Kitchen. Specialty Vitamins Products (BIOTIN PLUS KERATIN PO) Take 100 mg by mouth daily at 2 PM.     . Omega-3 Fatty Acids (FISH OIL PO) Take 1,000 mg by mouth daily at 2 PM. 2 tab daily     No current facility-administered medications on file prior to visit.     Past Medical History:  Diagnosis Date  . Amenorrhea    secondary to menopause  . Heart murmur   . Hyperlipidemia   . Tachycardia     Past Surgical History:  Procedure Laterality Date  . CESAREAN SECTION  2005    Social History   Socioeconomic History  . Marital status: Married    Spouse name: Not on file  . Number of children: 2  . Years of education: Not on file  . Highest education level: Not on file  Occupational History    Comment: Housewife  Social Needs  . Financial resource strain: Not on file  . Food insecurity    Worry: Not on file    Inability: Not on file  . Transportation needs    Medical: Not on file    Non-medical: Not on file  Tobacco Use  . Smoking status: Never Smoker  . Smokeless tobacco: Never Used  Substance and Sexual Activity  .  Alcohol use: Yes    Comment: social  . Drug use: No  . Sexual activity: Yes    Birth control/protection: Post-menopausal  Lifestyle  . Physical activity    Days per week: Not on file    Minutes per session: Not on file  . Stress: Not on file  Relationships  . Social Herbalist on phone: Not on file    Gets together: Not on file    Attends religious service: Not on file    Active member of club or organization: Not on file    Attends meetings of clubs or organizations: Not on file    Relationship status: Not on file  Other Topics Concern  . Not on file  Social History Narrative  . Not on file    Family History  Problem Relation Age of Onset  . Hyperlipidemia Mother   . Arthritis Mother   . Cancer Father        prostate cancer  . Arthritis Father   . Hypertension Father    . Heart attack Father   . Cancer Maternal Grandfather        lung cancer        ROS  Objective:   Vitals:   10/10/19 0829  BP: 136/86  Pulse: 74  Temp: 97.6 F (36.4 C)  SpO2: 99%    Body mass index is 30.46 kg/m.   Physical Examination:  Physical Exam Constitutional:      General: She is not in acute distress. HENT:     Right Ear: Tympanic membrane, ear canal and external ear normal.     Left Ear: Tympanic membrane, ear canal and external ear normal.  Eyes:     General: No scleral icterus.    Extraocular Movements: Extraocular movements intact.     Conjunctiva/sclera: Conjunctivae normal.     Pupils: Pupils are equal, round, and reactive to light.  Neck:     Musculoskeletal: Normal range of motion and neck supple.     Thyroid: No thyromegaly.  Cardiovascular:     Rate and Rhythm: Normal rate and regular rhythm.     Pulses: Normal pulses.     Heart sounds: Murmur present.  Pulmonary:     Effort: Pulmonary effort is normal.     Breath sounds: Normal breath sounds.  Chest:     Chest wall: No tenderness.  Abdominal:     General: Bowel sounds are normal. There is no distension.     Palpations: Abdomen is soft.     Tenderness: There is no abdominal tenderness.  Genitourinary:    Comments: Patient deferred breast and pelvic exam to GYN Musculoskeletal: Normal range of motion.        General: No tenderness.     Right lower leg: No edema.     Left lower leg: No edema.  Lymphadenopathy:     Cervical: No cervical adenopathy.  Skin:    General: Skin is warm and dry.     Findings: No erythema, lesion or rash.  Neurological:     Mental Status: She is alert and oriented to person, place, and time.  Psychiatric:        Mood and Affect: Mood normal.        Behavior: Behavior normal.        Thought Content: Thought content normal.     ASSESSMENT and PLAN:  Wendy Shaw was seen today for annual exam.  Diagnoses and all orders for this visit:  Preventative  health care -     Hepatitis C Antibody -     HIV antibody (with reflex) -     CBC -     Comprehensive metabolic panel -     TSH  Mixed hyperlipidemia -     Lipid panel -     rosuvastatin (CRESTOR) 10 MG tablet; TAKE 1 TABLET BY MOUTH DAILY AT BEDTIME  Encounter for hepatitis C screening test for low risk patient -     Hepatitis C Antibody  Encounter for screening for human immunodeficiency virus (HIV) -     HIV antibody (with reflex)  Need for influenza vaccination -     Flu Vaccine QUAD 36+ mos IM  Need for shingles vaccine -     Varicella-zoster vaccine IM    No problem-specific Assessment & Plan notes found for this encounter.      Problem List Items Addressed This Visit      Other   Hyperlipidemia   Relevant Medications   rosuvastatin (CRESTOR) 10 MG tablet   Other Relevant Orders   Lipid panel (Completed)    Other Visit Diagnoses    Preventative health care    -  Primary   Relevant Orders   Hepatitis C Antibody (Completed)   HIV antibody (with reflex) (Completed)   CBC (Completed)   Comprehensive metabolic panel (Completed)   TSH (Completed)   Encounter for hepatitis C screening test for low risk patient       Relevant Orders   Hepatitis C Antibody (Completed)   Encounter for screening for human immunodeficiency virus (HIV)       Relevant Orders   HIV antibody (with reflex) (Completed)   Need for influenza vaccination       Relevant Orders   Flu Vaccine QUAD 36+ mos IM (Completed)   Need for shingles vaccine       Relevant Orders   Varicella-zoster vaccine IM (Completed)      Follow up: Return in 6 months (on 04/08/2020) for (fasting), hyperlipidemia.  Alysia Penna, NP

## 2019-10-11 LAB — HEPATITIS C ANTIBODY
Hepatitis C Ab: NONREACTIVE
SIGNAL TO CUT-OFF: 0.01 (ref <1.00)

## 2019-10-11 LAB — HIV ANTIBODY (ROUTINE TESTING W REFLEX): HIV 1&2 Ab, 4th Generation: NONREACTIVE

## 2019-10-11 MED ORDER — ROSUVASTATIN CALCIUM 10 MG PO TABS: ORAL_TABLET | ORAL | 3 refills | Status: DC

## 2019-10-11 NOTE — Addendum Note (Signed)
Addended by: Leana Gamer on: 10/11/2019 02:39 PM   Modules accepted: Orders

## 2019-10-18 ENCOUNTER — Encounter: Payer: Self-pay | Admitting: Nurse Practitioner

## 2019-10-18 DIAGNOSIS — Z8489 Family history of other specified conditions: Secondary | ICD-10-CM | POA: Insufficient documentation

## 2019-10-18 DIAGNOSIS — Z803 Family history of malignant neoplasm of breast: Secondary | ICD-10-CM | POA: Insufficient documentation

## 2019-10-18 DIAGNOSIS — Z8041 Family history of malignant neoplasm of ovary: Secondary | ICD-10-CM | POA: Insufficient documentation

## 2019-10-19 ENCOUNTER — Encounter: Payer: Self-pay | Admitting: Nurse Practitioner

## 2019-10-19 NOTE — Progress Notes (Signed)
Abstracted result and sent to scan  

## 2019-12-13 ENCOUNTER — Ambulatory Visit: Payer: 59

## 2019-12-15 ENCOUNTER — Other Ambulatory Visit: Payer: Self-pay

## 2019-12-15 ENCOUNTER — Ambulatory Visit (INDEPENDENT_AMBULATORY_CARE_PROVIDER_SITE_OTHER): Payer: 59

## 2019-12-15 DIAGNOSIS — Z23 Encounter for immunization: Secondary | ICD-10-CM

## 2019-12-15 NOTE — Progress Notes (Signed)
Pt came into the office today to get 2nd shingle vaccine, gave injection into the left deltoid, pt tolerated injection well and gave information sheet to patient to look over.

## 2020-04-09 ENCOUNTER — Other Ambulatory Visit: Payer: Self-pay | Admitting: Obstetrics and Gynecology

## 2020-04-09 DIAGNOSIS — R928 Other abnormal and inconclusive findings on diagnostic imaging of breast: Secondary | ICD-10-CM

## 2020-04-12 ENCOUNTER — Telehealth: Payer: Self-pay

## 2020-04-12 NOTE — Telephone Encounter (Signed)
NOTES ON FILE FROM [facility] [place] [phone], SENT REFERRAL TO SCHEDULING 

## 2020-04-13 ENCOUNTER — Other Ambulatory Visit: Payer: Self-pay | Admitting: Obstetrics and Gynecology

## 2020-04-13 DIAGNOSIS — Z9189 Other specified personal risk factors, not elsewhere classified: Secondary | ICD-10-CM

## 2020-05-14 ENCOUNTER — Other Ambulatory Visit: Payer: Self-pay

## 2020-05-14 ENCOUNTER — Other Ambulatory Visit: Payer: Self-pay | Admitting: Obstetrics and Gynecology

## 2020-05-14 ENCOUNTER — Ambulatory Visit
Admission: RE | Admit: 2020-05-14 | Discharge: 2020-05-14 | Disposition: A | Discharge status: Home / Self Care | Payer: 59 | Source: Ambulatory Visit | Attending: Obstetrics and Gynecology | Admitting: Obstetrics and Gynecology

## 2020-05-14 DIAGNOSIS — R928 Other abnormal and inconclusive findings on diagnostic imaging of breast: Secondary | ICD-10-CM

## 2020-05-21 ENCOUNTER — Ambulatory Visit: Payer: 59 | Admitting: Interventional Cardiology

## 2020-05-21 ENCOUNTER — Encounter: Payer: Self-pay | Admitting: Interventional Cardiology

## 2020-05-21 ENCOUNTER — Other Ambulatory Visit: Payer: Self-pay

## 2020-05-21 VITALS — BP 126/80 | HR 73 | Ht 61.0 in | Wt 161.2 lb

## 2020-05-21 DIAGNOSIS — R072 Precordial pain: Secondary | ICD-10-CM | POA: Diagnosis not present

## 2020-05-21 DIAGNOSIS — E782 Mixed hyperlipidemia: Secondary | ICD-10-CM

## 2020-05-21 DIAGNOSIS — M79622 Pain in left upper arm: Secondary | ICD-10-CM

## 2020-05-21 DIAGNOSIS — Z8249 Family history of ischemic heart disease and other diseases of the circulatory system: Secondary | ICD-10-CM

## 2020-05-21 MED ORDER — METOPROLOL TARTRATE 50 MG PO TABS
ORAL_TABLET | ORAL | 0 refills | Status: DC
Start: 2020-05-21 — End: 2022-05-26

## 2020-05-21 NOTE — Patient Instructions (Addendum)
Medication Instructions:  Your physician recommends that you continue on your current medications as directed. Please refer to the Current Medication list given to you today.  *If you need a refill on your cardiac medications before your next appointment, please call your pharmacy*   Lab Work: None  If you have labs (blood work) drawn today and your tests are completely normal, you will receive your results only by: Marland Kitchen MyChart Message (if you have MyChart) OR . A paper copy in the mail If you have any lab test that is abnormal or we need to change your treatment, we will call you to review the results.   Testing/Procedures: Your physician has requested that you have cardiac CT.  Follow-Up: Based on test results   Other Instructions Your cardiac CT will be scheduled at one of the below locations:   [facility] [address] [place], [place] [address] [phone]  [place] [address] [place], [place] [address] [phone]  If scheduled at [facility], please arrive at the [facility] main entrance of [facility] 30 minutes prior to test start time. Proceed to the [facility] Radiology Department (first floor) to check-in and test prep.  If scheduled at [facility], please arrive 15 mins early for check-in and test prep.  Please follow these instructions carefully (unless otherwise directed):  On the Night Before the Test: . Be sure to Drink plenty of water. . Do not consume any caffeinated/decaffeinated beverages or chocolate 12 hours prior to your test. . Do not take any antihistamines 12 hours prior to your test.   On the Day of the Test: . Drink plenty of water. Do not drink any water within one hour of the test. . Do not eat any food 4 hours prior to the test. . You may take your regular medications prior to the test.  . Take metoprolol (Lopressor)  50 MG two hours prior to test. . FEMALES- please wear underwire-free bra if available     After the Test: . Drink plenty of water. . After receiving IV contrast, you may experience a mild flushed feeling. This is normal. . On occasion, you may experience a mild rash up to 24 hours after the test. This is not dangerous. If this occurs, you can take Benadryl 25 mg and increase your fluid intake. . If you experience trouble breathing, this can be serious. If it is severe call 911 IMMEDIATELY. If it is mild, please call our office.   Once we have confirmed authorization from your insurance company, we will call you to set up a date and time for your test.   For non-scheduling related questions, please contact the cardiac imaging nurse navigator should you have any questions/concerns: Marchia Bond, Cardiac Imaging Nurse Navigator Burley Saver, Interim Cardiac Imaging Nurse [place] [facility] Direct Office Dial: [phone]   For scheduling needs, including cancellations and rescheduling, please call [phone].

## 2020-05-21 NOTE — Progress Notes (Signed)
Cardiology Office Note   Date:  05/21/2020   ID:  Riki Altes, DOB 04-Dec-1961, MRN [id number]  PCP:  Flossie Buffy, NP    No chief complaint on file.  Left arm pain  Wt Readings from Last 3 Encounters:  05/21/20 161 lb 3.2 oz (73.1 kg)  10/10/19 161 lb 3.2 oz (73.1 kg)  02/01/18 152 lb (68.9 kg)       History of Present Illness: Rosalinda Seaman is a 59 y.o. female with hyperlipidemia who is being seen today for the evaluation of left arm pain at the request of Arvella Nigh, MD.  [facility] has left arm pain 3-4x/week.  It is a dull ache.  Lasts 10-60 minutes at a time.  At night, she can feel a center of her chest.  Not related to eating.  Walking is her most strenuous activity.  Family h/o CAD.  Father died in his 79s with CAD.  No siblings with CAD.  Mother with angina.   Denies : Dizziness. Leg edema. Nitroglycerin use. Orthopnea. Palpitations. Paroxysmal nocturnal dyspnea. Shortness of breath. Syncope.   Past Medical History:  Diagnosis Date  . Amenorrhea    secondary to menopause  . Chest pressure   . Family history of breast cancer   . Family history of heart disease   . Family history of ovarian cancer   . Heart murmur   . Hyperlipidemia   . Osteopenia   . Post-menopausal   . Systolic ejection murmur   . Tachycardia     Past Surgical History:  Procedure Laterality Date  . CESAREAN SECTION  2005     Current Outpatient Medications  Medication Sig Dispense Refill  . CALCIUM-VITAMIN D PO Take 1,000 mg by mouth daily. 2 tab daily,Calcium, magnesium & Zinc    . Glucosamine Sulfate 1000 MG TABS Take 1,000 mg by mouth daily at 2 PM. 2 tab daily    . IBUPROFEN PO Take by mouth.    . meclizine (ANTIVERT) 12.5 MG tablet Take 1 tablet (12.5 mg total) by mouth 2 (two) times daily as needed for dizziness (do not use for more than 3days). 6 tablet 0  . Multiple Vitamin (MULTIVITAMIN) capsule Take 1 capsule by mouth daily.    . Omega-3 Fatty Acids (FISH OIL PO) Take  1,000 mg by mouth daily at 2 PM. 2 tab daily    . rosuvastatin (CRESTOR) 10 MG tablet TAKE 1 TABLET BY MOUTH DAILY AT BEDTIME 90 tablet 3  . Specialty Vitamins Products (BIOTIN PLUS KERATIN PO) Take 100 mg by mouth daily at 2 PM.      No current facility-administered medications for this visit.    Allergies:   Pollen extract    Social History:  The patient  reports that she has never smoked. She has never used smokeless tobacco. She reports current alcohol use. She reports that she does not use drugs.   Family History:  The patient's *family history includes Arthritis in her father and mother; Breast cancer in her sister; Cancer in her father and maternal grandfather; Heart attack in her father; Hyperlipidemia in her mother; Hypertension in her father; Ovarian cancer in her maternal aunt.    ROS:  Please see the history of present illness.   Otherwise, review of systems are positive for .   All other systems are reviewed and negative.    PHYSICAL EXAM: VS:  BP 126/80   Pulse 73   Ht 5\' 1"  (1.549 m)   Wt 161  lb 3.2 oz (73.1 kg)   SpO2 99%   BMI 30.46 kg/m  , BMI Body mass index is 30.46 kg/m. GEN: Well nourished, well developed, in no acute distress  HEENT: normal  Neck: no JVD, carotid bruits, or masses Cardiac: RRR; no murmurs, rubs, or gallops,no edema  Respiratory:  clear to auscultation bilaterally, normal work of breathing GI: soft, nontender, nondistended, + BS MS: no deformity or atrophy  Skin: warm and dry, no rash Neuro:  Strength and sensation are intact Psych: euthymic mood, full affect   EKG:   The ekg ordered today demonstrates NSR no ST changes   Recent Labs: 10/10/2019: ALT 16; BUN 14; Creatinine, Ser 0.50; Hemoglobin 13.7; Platelets 299.0; Potassium 4.1; Sodium 142; TSH 1.41   Lipid Panel    Component Value Date/Time   CHOL 233 (H) 10/10/2019 0909   TRIG 63.0 10/10/2019 0909   HDL 72.70 10/10/2019 0909   CHOLHDL 3 10/10/2019 0909   VLDL 12.6  10/10/2019 0909   LDLCALC 148 (H) 10/10/2019 0909     Other studies Reviewed: Additional studies/ records that were reviewed today with results demonstrating: Cr 0.5 in 10/2019.   ASSESSMENT AND PLAN:  1. Chest pain/left arm pain: Some typical and some atypical features.  Plan for CTA coronaries to further eval given RF for CAD including FH of CAD.   Metoprolol 50 mg prior to scan. 2. Hyperlipidemia:  LDL 148 despite Crestor.  Based on CT, we can see if we have to be more aggressive with statin therapy.  If low calcium score and minimal plaque, ay not need more aggressive therapy.  CTA would be more useful than stress test in this case.  3. Would benefit from more regular exercise and healthy diet as noted below.     Current medicines are reviewed at length with the patient today.  The patient concerns regarding her medicines were addressed.  The following changes have been made:  No change  Labs/ tests ordered today include:  No orders of the defined types were placed in this encounter.   Recommend 150 minutes/week of aerobic exercise Low fat, low carb, high fiber diet recommended  Disposition:   FU based on CT results   Signed, Lance Muss, MD  05/21/2020 2:16 PM    [facility] [address] [place], [place], [place]  [address] Phone: [phone]; Fax: [phone]

## 2020-06-25 ENCOUNTER — Telehealth: Payer: Self-pay | Admitting: Nurse Practitioner

## 2020-06-25 NOTE — Telephone Encounter (Signed)
Left detailed message per DPR letting the pt know that I contacted the pharmacy and she has refills available for her. Also let her know to call us if she had any questions or concerns.

## 2020-06-25 NOTE — Telephone Encounter (Addendum)
Patient is calling and refill for rosuvastatin sent to [facility] on [address] please advise. CB is [phone]

## 2020-07-09 ENCOUNTER — Ambulatory Visit: Payer: 59 | Admitting: Nurse Practitioner

## 2020-07-12 ENCOUNTER — Telehealth: Payer: Self-pay | Admitting: Nurse Practitioner

## 2020-07-12 DIAGNOSIS — E782 Mixed hyperlipidemia: Secondary | ICD-10-CM

## 2020-07-12 NOTE — Telephone Encounter (Signed)
Patient called and wanted to see if  Wendy Shaw can put orders in to check her cholesterol, please advise. CB is [phone]

## 2020-07-13 ENCOUNTER — Ambulatory Visit: Payer: 59 | Admitting: Nurse Practitioner

## 2020-07-23 NOTE — Telephone Encounter (Signed)
Wendy Shaw please advise pt message, pt asking if she can have orders put in to check her cholesterol levels to see where she is at.

## 2020-07-24 NOTE — Telephone Encounter (Signed)
Left pt a voicemail to call office so we can schedule her for a cholesterol blood draw.

## 2020-07-24 NOTE — Addendum Note (Signed)
Addended by: Rene Paci on: 07/24/2020 11:31 AM   Modules accepted: Orders

## 2020-07-24 NOTE — Telephone Encounter (Signed)
Ok to schedule order for lipid panel. Needs to be fasting at least 6hrs prior to blood draw. Due for CPE in November

## 2020-08-02 NOTE — Telephone Encounter (Signed)
Left pt a voicemail to call and schedule her blood draw for her cholesterol, I also sent pt a my chart message letting her know.

## 2020-08-06 NOTE — Telephone Encounter (Signed)
Left pt a detailed voicemail again to try and scheduled her fasting lipid panel blood draw with lab and her physical for November. I informed pt to call office an schedule with [place].

## 2020-08-27 ENCOUNTER — Other Ambulatory Visit: Payer: Self-pay

## 2020-08-28 ENCOUNTER — Other Ambulatory Visit (INDEPENDENT_AMBULATORY_CARE_PROVIDER_SITE_OTHER): Payer: 59

## 2020-08-28 ENCOUNTER — Ambulatory Visit: Payer: 59 | Admitting: Nurse Practitioner

## 2020-08-28 DIAGNOSIS — E785 Hyperlipidemia, unspecified: Secondary | ICD-10-CM | POA: Diagnosis not present

## 2020-08-28 DIAGNOSIS — E782 Mixed hyperlipidemia: Secondary | ICD-10-CM

## 2020-08-28 LAB — LIPID PANEL
Cholesterol: 217 mg/dL — ABNORMAL HIGH (ref 0–200)
HDL: 74.3 mg/dL (ref >39.00)
LDL Cholesterol: 130 mg/dL — ABNORMAL HIGH (ref 0–99)
NonHDL: 143.02
Total CHOL/HDL Ratio: 3
Triglycerides: 65 mg/dL (ref 0.0–149.0)
VLDL: 13 mg/dL (ref 0.0–40.0)

## 2020-08-31 MED ORDER — ROSUVASTATIN CALCIUM 10 MG PO TABS: ORAL_TABLET | ORAL | 1 refills | Status: DC

## 2020-08-31 NOTE — Addendum Note (Signed)
Addended by: Michaela Corner on: 08/31/2020 05:27 PM   Modules accepted: Orders

## 2020-10-30 ENCOUNTER — Other Ambulatory Visit: Payer: Self-pay | Admitting: Nurse Practitioner

## 2020-10-30 DIAGNOSIS — E782 Mixed hyperlipidemia: Secondary | ICD-10-CM

## 2020-10-30 NOTE — Telephone Encounter (Signed)
Last OV 10/10/19 Last fill 08/31/20 #90/1 Patient need a office visit before any other refills.

## 2020-11-16 ENCOUNTER — Other Ambulatory Visit: Payer: 59

## 2020-11-27 ENCOUNTER — Other Ambulatory Visit: Payer: Self-pay

## 2020-11-27 ENCOUNTER — Ambulatory Visit: Payer: 59 | Admitting: Family

## 2020-11-27 ENCOUNTER — Encounter: Payer: Self-pay | Admitting: Family

## 2020-11-27 ENCOUNTER — Ambulatory Visit (INDEPENDENT_AMBULATORY_CARE_PROVIDER_SITE_OTHER): Payer: 59

## 2020-11-27 VITALS — BP 116/70 | HR 86 | Temp 97.8°F | Ht 61.0 in | Wt 162.4 lb

## 2020-11-27 DIAGNOSIS — M25561 Pain in right knee: Secondary | ICD-10-CM

## 2020-11-27 DIAGNOSIS — E782 Mixed hyperlipidemia: Secondary | ICD-10-CM

## 2020-11-27 MED ORDER — ROSUVASTATIN CALCIUM 20 MG PO TABS: 20.0000 mg | ORAL_TABLET | Freq: Every day | ORAL | 3 refills | Status: DC

## 2020-11-27 MED ORDER — DICLOFENAC SODIUM 75 MG PO TBEC
75.0000 mg | DELAYED_RELEASE_TABLET | Freq: Two times a day (BID) | ORAL | 1 refills | Status: DC
Start: 2020-11-27 — End: 2021-12-26

## 2020-11-27 NOTE — Progress Notes (Signed)
Acute Office Visit  Subjective:    Patient ID: Wendy Shaw, female    DOB: September 21, 1961, 59 y.o.   MRN: [id number]  Chief Complaint  Patient presents with  . Knee Pain    Right knee pain x 1 week seems to come and go.     HPI Patient is in today with c/o right knee pain that waxes and wanes in intensity from 2-7. She reports the pain being worse after sitting and better when she is up an moving around. She has been very busy lately and may have overused it. No known injury. History of hiking. Has tried ibuprofen that helps. She is interested in a cortisone injection  Also would like to increase her Crestor dose to lower her LDL.   Past Medical History:  Diagnosis Date  . Amenorrhea    secondary to menopause  . Chest pressure   . Family history of breast cancer   . Family history of heart disease   . Family history of ovarian cancer   . Heart murmur   . Hyperlipidemia   . Osteopenia   . Post-menopausal   . Systolic ejection murmur   . Tachycardia     Past Surgical History:  Procedure Laterality Date  . CESAREAN SECTION  2005    Family History  Problem Relation Age of Onset  . Hyperlipidemia Mother   . Arthritis Mother   . Cancer Father        prostate cancer, 86  . Arthritis Father   . Hypertension Father   . Heart attack Father   . Cancer Maternal Grandfather        lung cancer  . Breast cancer Sister        32  . Ovarian cancer Maternal Aunt        49    Social History   Socioeconomic History  . Marital status: Married    Spouse name: Not on file  . Number of children: 2  . Years of education: Not on file  . Highest education level: Not on file  Occupational History    Comment: Housewife  Tobacco Use  . Smoking status: Never Smoker  . Smokeless tobacco: Never Used  Vaping Use  . Vaping Use: Never used  Substance and Sexual Activity  . Alcohol use: Yes    Comment: social  . Drug use: No  . Sexual activity: Yes    Birth control/protection:  Post-menopausal  Other Topics Concern  . Not on file  Social History Narrative  . Not on file   Social Determinants of Health   Financial Resource Strain: Not on file  Food Insecurity: Not on file  Transportation Needs: Not on file  Physical Activity: Not on file  Stress: Not on file  Social Connections: Not on file  Intimate Partner Violence: Not on file    Outpatient Medications Prior to Visit  Medication Sig Dispense Refill  . CALCIUM-VITAMIN D PO Take 1,000 mg by mouth daily. 2 tab daily,Calcium, magnesium & Zinc    . Glucosamine Sulfate 1000 MG TABS Take 1,000 mg by mouth daily at 2 PM. 2 tab daily    . Multiple Vitamin (MULTIVITAMIN) capsule Take 1 capsule by mouth daily.    . Omega-3 Fatty Acids (FISH OIL PO) Take 1,000 mg by mouth daily at 2 PM. 2 tab daily    . Specialty Vitamins Products (BIOTIN PLUS KERATIN PO) Take 100 mg by mouth daily at 2 PM.     .  IBUPROFEN PO Take by mouth.    . rosuvastatin (CRESTOR) 10 MG tablet TAKE 1 TABLET BY MOUTH DAILY AT BEDTIME 30 tablet 0  . meclizine (ANTIVERT) 12.5 MG tablet Take 1 tablet (12.5 mg total) by mouth 2 (two) times daily as needed for dizziness (do not use for more than 3days). (Patient not taking: Reported on 11/27/2020) 6 tablet 0  . metoprolol tartrate (LOPRESSOR) 50 MG tablet Take 1 tablet by mouth 2 hours prior to Cardiac CT (Patient not taking: Reported on 11/27/2020) 1 tablet 0   No facility-administered medications prior to visit.    Allergies  Allergen Reactions  . Pollen Extract Other (See Comments)    Allergy,sinus    Review of Systems  Constitutional: Negative.   Respiratory: Negative.   Cardiovascular: Negative.   Musculoskeletal: Positive for arthralgias and gait problem.       Right knee pain  Skin: Negative.   Neurological: Negative for weakness.  Psychiatric/Behavioral: Negative.        Objective:    Physical Exam Vitals reviewed.  Constitutional:      Appearance: Normal appearance. She  is obese.  Cardiovascular:     Rate and Rhythm: Normal rate and regular rhythm.  Pulmonary:     Effort: Pulmonary effort is normal.     Breath sounds: Normal breath sounds.  Musculoskeletal:        General: No swelling, tenderness, deformity or signs of injury.     Cervical back: Normal range of motion.     Right lower leg: No edema.     Left lower leg: No edema.     Comments: Positive for crepitus bilateral knees  Skin:    General: Skin is warm and dry.  Neurological:     General: No focal deficit present.     Mental Status: She is alert and oriented to person, place, and time.     Motor: No weakness.     Coordination: Coordination normal.     Deep Tendon Reflexes: Reflexes normal.  Psychiatric:        Mood and Affect: Mood normal.     BP 116/70   Pulse 86   Temp 97.8 F (36.6 C) (Temporal)   Ht 5\' 1"  (1.549 m)   Wt 162 lb 6.4 oz (73.7 kg)   SpO2 97%   BMI 30.69 kg/m  Wt Readings from Last 3 Encounters:  11/27/20 162 lb 6.4 oz (73.7 kg)  05/21/20 161 lb 3.2 oz (73.1 kg)  10/10/19 161 lb 3.2 oz (73.1 kg)    Health Maintenance Due  Topic Date Due  . TETANUS/TDAP  Never done  . PAP SMEAR-Modifier  Never done  . MAMMOGRAM  Never done    There are no preventive care reminders to display for this patient.   Lab Results  Component Value Date   TSH 1.41 10/10/2019   Lab Results  Component Value Date   WBC 5.3 10/10/2019   HGB 13.7 10/10/2019   HCT 41.9 10/10/2019   MCV 89.6 10/10/2019   PLT 299.0 10/10/2019   Lab Results  Component Value Date   NA 142 10/10/2019   K 4.1 10/10/2019   CO2 30 10/10/2019   GLUCOSE 91 10/10/2019   BUN 14 10/10/2019   CREATININE 0.50 10/10/2019   BILITOT 0.5 10/10/2019   ALKPHOS 88 10/10/2019   AST 16 10/10/2019   ALT 16 10/10/2019   PROT 6.8 10/10/2019   ALBUMIN 4.1 10/10/2019   CALCIUM 9.0 10/10/2019   GFR 126.80 10/10/2019  Lab Results  Component Value Date   CHOL 217 (H) 08/28/2020   Lab Results  Component  Value Date   HDL 74.30 08/28/2020   Lab Results  Component Value Date   LDLCALC 130 (H) 08/28/2020   Lab Results  Component Value Date   TRIG 65.0 08/28/2020   Lab Results  Component Value Date   CHOLHDL 3 08/28/2020   No results found for: HGBA1C     Assessment & Plan:   Problem List Items Addressed This Visit    Hyperlipidemia   Relevant Medications   rosuvastatin (CRESTOR) 20 MG tablet    Other Visit Diagnoses    Acute pain of right knee    -  Primary   Relevant Orders   DG Knee Complete 4 Views Right       Meds ordered this encounter  Medications  . diclofenac (VOLTAREN) 75 MG EC tablet    Sig: Take 1 tablet (75 mg total) by mouth 2 (two) times daily.    Dispense:  30 tablet    Refill:  1  . rosuvastatin (CRESTOR) 20 MG tablet    Sig: Take 1 tablet (20 mg total) by mouth daily.    Dispense:  90 tablet    Refill:  3    Will consider cortisone injection if symptoms persist. For now, Volatren with food twice a day  Eulis Foster, FNP

## 2020-11-27 NOTE — Patient Instructions (Signed)

## 2021-10-21 DIAGNOSIS — M8580 Other specified disorders of bone density and structure, unspecified site: Secondary | ICD-10-CM | POA: Insufficient documentation

## 2021-12-03 ENCOUNTER — Other Ambulatory Visit: Payer: Self-pay | Admitting: Family

## 2021-12-03 IMAGING — MG MM DIGITAL DIAGNOSTIC UNILAT*L* W/ TOMO W/ CAD
6 series · 6 of 18 positions shown · non-contrast
Comparison: Previous exam(s).

CLINICAL DATA: Patient was called back from screening mammogram for
a possible mass in the left breast.

EXAM:
DIGITAL DIAGNOSTIC LEFT MAMMOGRAM WITH CAD AND TOMO
ULTRASOUND LEFT BREAST

[L XCCL synth-2D]
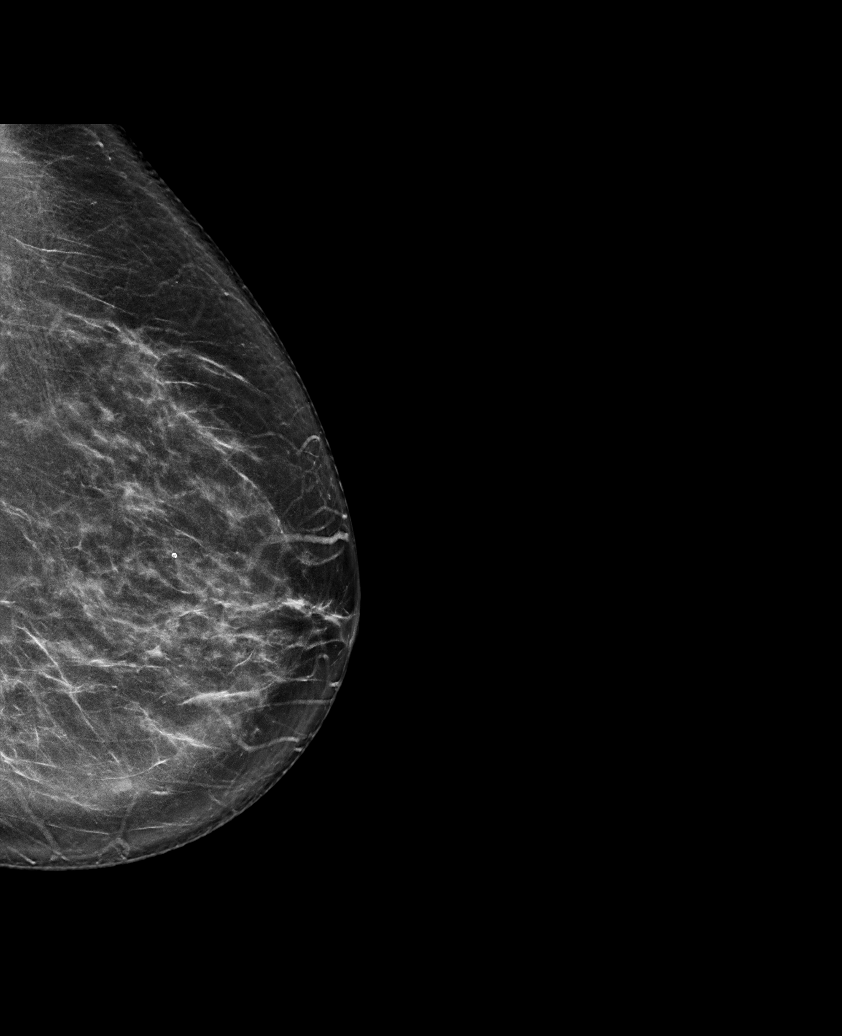

[L MLO synth-2D]
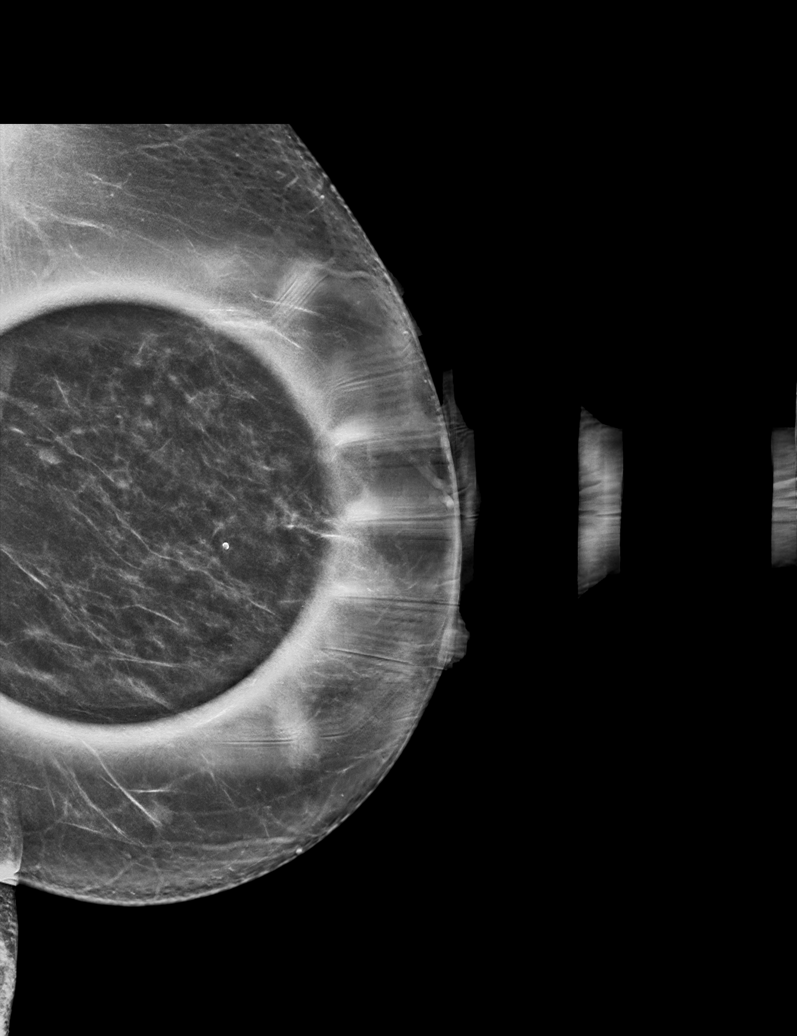

[L ML synth-2D]
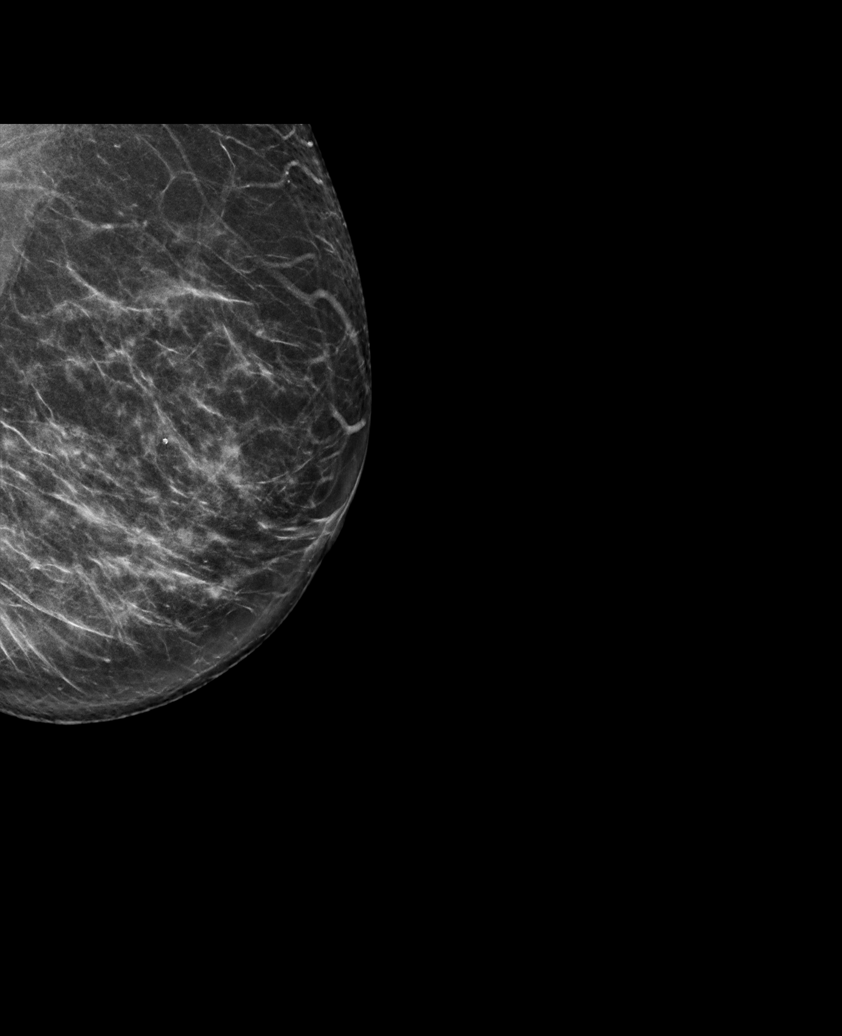

[L MLO tomo · tomo slice 41/80.0]
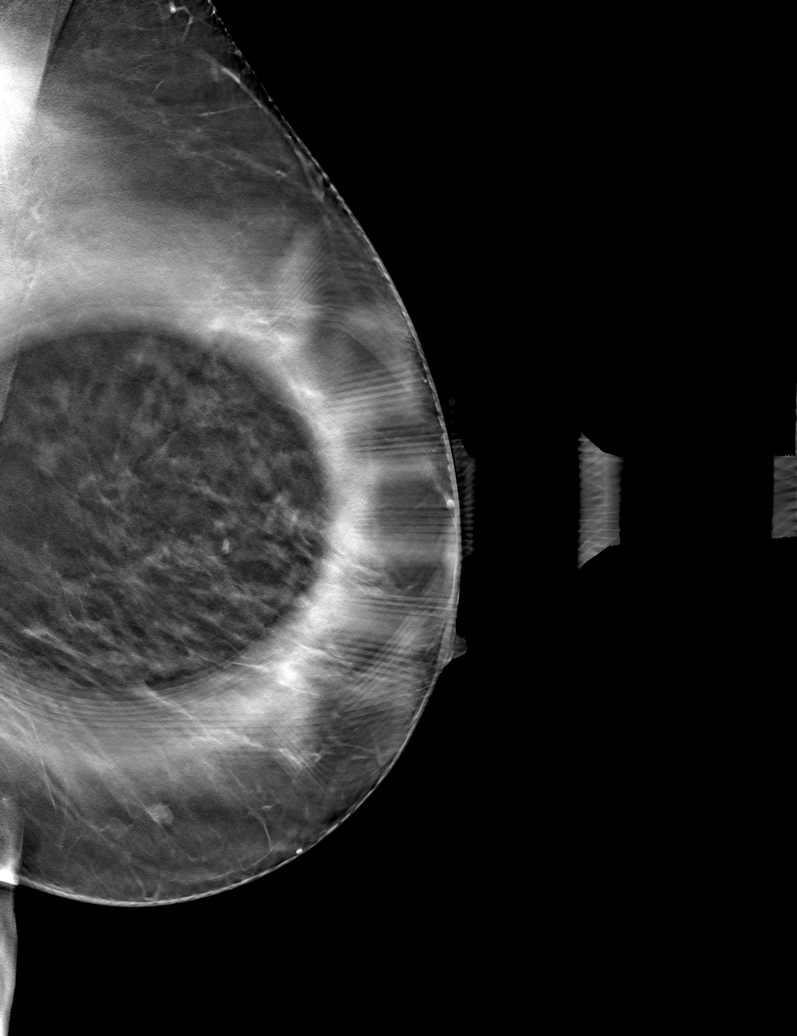

[L XCCL tomo · tomo slice 41/82.0]
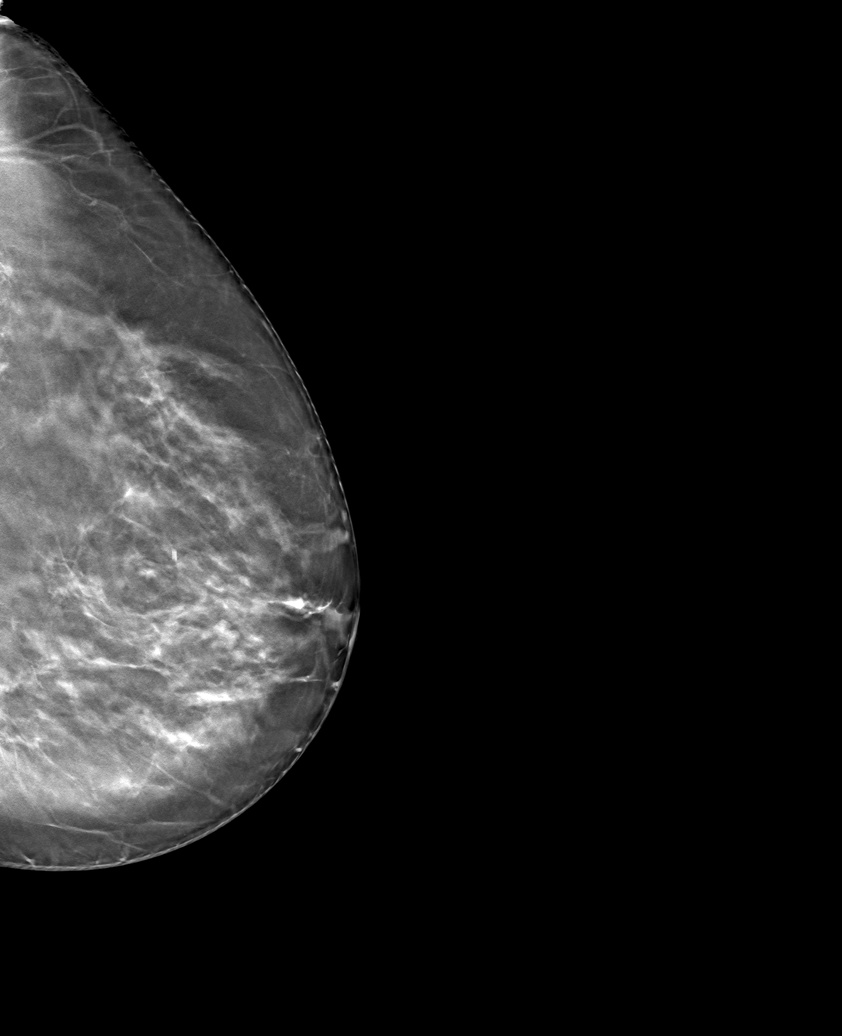

[L ML tomo · tomo slice 39/78.0]
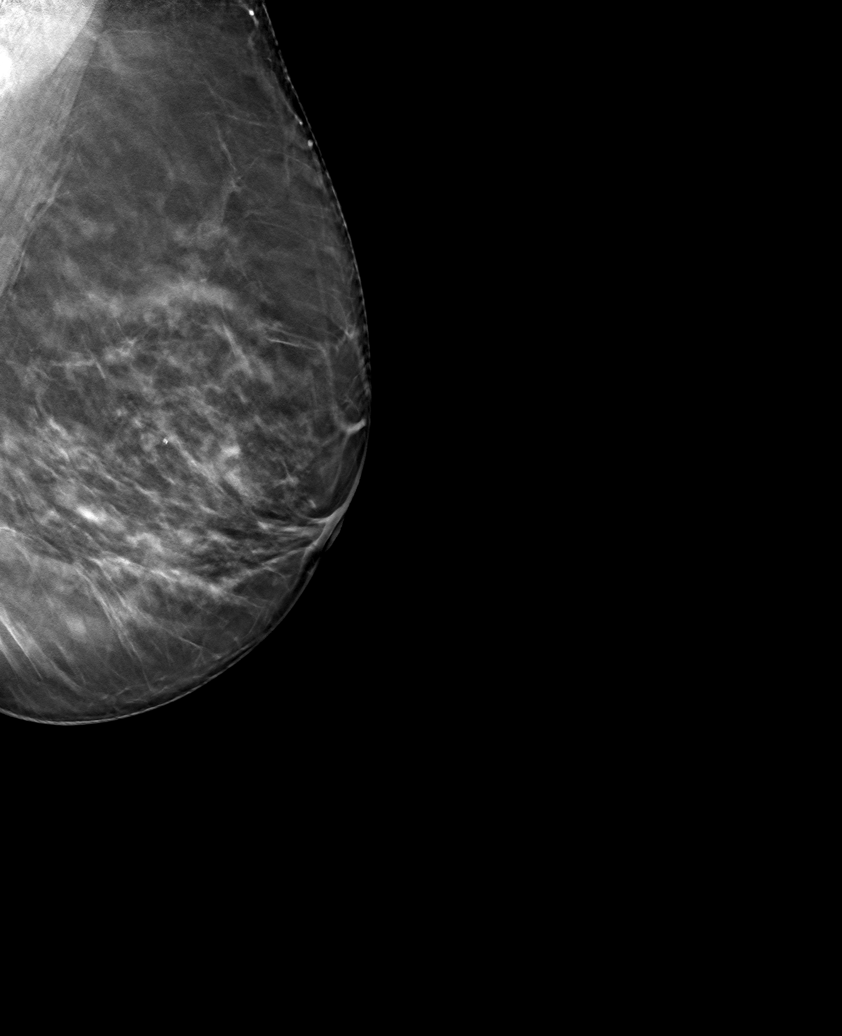

[6 of 18 positions shown; findings below may reference images not displayed]

ACR Breast Density Category c: The breast tissue is heterogeneously
dense, which may obscure small masses.
FINDINGS: Additional imaging of the left breast was performed. There is
persistence of a 5 mm mass in the posterior aspect of the
upper-outer quadrant of the breast. There are no malignant type
microcalcifications.

Mammographic images were processed with CAD.

Targeted ultrasound is performed, showing a hypoechoic mass with
increased through transmission in the left breast at 2 o'clock 4 cm
from the nipple measuring 4 x 1 x 4 mm. This is likely a cyst and
likely corresponds with the mammographic abnormality.
IMPRESSION: Probable benign cyst in the left breast.

RECOMMENDATION:
Short-term interval follow-up left mammogram and ultrasound in 6
months is recommended.

I have discussed the findings and recommendations with the patient.
If applicable, a reminder letter will be sent to the patient
regarding the next appointment.

BI-RADS CATEGORY  3: Probably benign.

## 2021-12-26 ENCOUNTER — Telehealth: Payer: Self-pay | Admitting: Nurse Practitioner

## 2021-12-26 ENCOUNTER — Other Ambulatory Visit: Payer: Self-pay

## 2021-12-26 ENCOUNTER — Encounter: Payer: Self-pay | Admitting: Nurse Practitioner

## 2021-12-26 ENCOUNTER — Ambulatory Visit (INDEPENDENT_AMBULATORY_CARE_PROVIDER_SITE_OTHER): Payer: No Typology Code available for payment source | Admitting: Nurse Practitioner

## 2021-12-26 VITALS — BP 109/66 | HR 72 | Temp 98.0°F | Wt 156.8 lb

## 2021-12-26 DIAGNOSIS — H8111 Benign paroxysmal vertigo, right ear: Secondary | ICD-10-CM

## 2021-12-26 MED ORDER — MECLIZINE HCL 12.5 MG PO TABS: 12.5000 mg | ORAL_TABLET | Freq: Two times a day (BID) | ORAL | 0 refills | Status: DC | PRN

## 2021-12-26 MED ORDER — ONDANSETRON 4 MG PO TBDP: 4.0000 mg | ORAL_TABLET | Freq: Three times a day (TID) | ORAL | 0 refills | Status: DC | PRN

## 2021-12-26 NOTE — Progress Notes (Signed)
Acute Office Visit  Subjective:    Patient ID: Wendy Shaw, female    DOB: 06/23/1961, 61 y.o.   MRN: [id number]  Chief Complaint  Patient presents with   Dizziness    Pt c/o vertigo symptoms x5 days.     HPI Patient is in today for vertigo for the past 5 days. Her dog jumped on her and then she jerked her head quickly.   DIZZINESS  Duration: days Description of symptoms:  feels like water is moving across her head Duration of episode: seconds Dizziness frequency:  has had vertigo once 10 years ago Provoking factors:  laying on her right side, bending over Aggravating factors:   laying on her right side, bending over Triggered by rolling over in bed:  unsure Triggered by bending over: yes Aggravated by head movement: yes Aggravated by exertion, coughing, loud noises: no Recent head injury: no Recent or current viral symptoms: no History of vasovagal episodes: no Nausea: yes Vomiting: no Tinnitus: yes Hearing loss: no Aural fullness: no Headache: yes - felt better after drinking caffeine Photophobia/phonophobia: no Unsteady gait: no Postural instability: no Diplopia, dysarthria, dysphagia or weakness: no Related to exertion: no Pallor: no Diaphoresis: no Dyspnea: no Chest pain: no   Past Medical History:  Diagnosis Date   Amenorrhea    secondary to menopause   Chest pressure    Family history of breast cancer    Family history of heart disease    Family history of ovarian cancer    Heart murmur    Hyperlipidemia    Osteopenia    Post-menopausal    Systolic ejection murmur    Tachycardia     Past Surgical History:  Procedure Laterality Date   CESAREAN SECTION  2005    Family History  Problem Relation Age of Onset   Hyperlipidemia Mother    Arthritis Mother    Cancer Father        prostate cancer, 4977   Arthritis Father    Hypertension Father    Heart attack Father    Cancer Maternal Grandfather        lung cancer   Breast cancer Sister         6046   Ovarian cancer Maternal Aunt        5063    Social History   Socioeconomic History   Marital status: Married    Spouse name: Not on file   Number of children: 2   Years of education: Not on file   Highest education level: Not on file  Occupational History    Comment: Housewife  Tobacco Use   Smoking status: Never   Smokeless tobacco: Never  Vaping Use   Vaping Use: Never used  Substance and Sexual Activity   Alcohol use: Yes    Comment: social   Drug use: No   Sexual activity: Yes    Birth control/protection: Post-menopausal  Other Topics Concern   Not on file  Social History Narrative   Not on file   Social Determinants of Health   Financial Resource Strain: Not on file  Food Insecurity: Not on file  Transportation Needs: Not on file  Physical Activity: Not on file  Stress: Not on file  Social Connections: Not on file  Intimate Partner Violence: Not on file    Outpatient Medications Prior to Visit  Medication Sig Dispense Refill   CALCIUM-VITAMIN D PO Take 1,000 mg by mouth daily. 2 tab daily,Calcium, magnesium & Zinc  metoprolol tartrate (LOPRESSOR) 50 MG tablet Take 1 tablet by mouth 2 hours prior to Cardiac CT (Patient not taking: Reported on 11/27/2020) 1 tablet 0   Multiple Vitamin (MULTIVITAMIN) capsule Take 1 capsule by mouth daily.     Omega-3 Fatty Acids (FISH OIL PO) Take 1,000 mg by mouth daily at 2 PM. 2 tab daily     rosuvastatin (CRESTOR) 20 MG tablet TAKE 1 TABLET(20 MG) BY MOUTH DAILY 90 tablet 3   Specialty Vitamins Products (BIOTIN PLUS KERATIN PO) Take 100 mg by mouth daily at 2 PM.      diclofenac (VOLTAREN) 75 MG EC tablet Take 1 tablet (75 mg total) by mouth 2 (two) times daily. 30 tablet 1   Glucosamine Sulfate 1000 MG TABS Take 1,000 mg by mouth daily at 2 PM. 2 tab daily     meclizine (ANTIVERT) 12.5 MG tablet Take 1 tablet (12.5 mg total) by mouth 2 (two) times daily as needed for dizziness (do not use for more than 3days).  (Patient not taking: Reported on 11/27/2020) 6 tablet 0   No facility-administered medications prior to visit.    Allergies  Allergen Reactions   Pollen Extract Other (See Comments)    Allergy,sinus    Review of Systems  See pertinent positives and negatives per HPI.    Objective:    Physical Exam Vitals and nursing note reviewed.  Constitutional:      General: She is not in acute distress.    Appearance: Normal appearance.  HENT:     Head: Normocephalic.     Right Ear: Tympanic membrane, ear canal and external ear normal.     Left Ear: Tympanic membrane, ear canal and external ear normal.  Eyes:     Extraocular Movements: Extraocular movements intact.     Conjunctiva/sclera: Conjunctivae normal.     Pupils: Pupils are equal, round, and reactive to light.  Cardiovascular:     Rate and Rhythm: Normal rate and regular rhythm.     Pulses: Normal pulses.     Heart sounds: Normal heart sounds.  Pulmonary:     Effort: Pulmonary effort is normal.     Breath sounds: Normal breath sounds.  Musculoskeletal:     Cervical back: Normal range of motion.  Skin:    General: Skin is warm.  Neurological:     General: No focal deficit present.     Mental Status: She is alert and oriented to person, place, and time.     Comments: Positive dix-hallpike maneuver on right side  Psychiatric:        Mood and Affect: Mood normal.        Behavior: Behavior normal.        Thought Content: Thought content normal.        Judgment: Judgment normal.    BP 109/66    Pulse 72    Temp 98 F (36.7 C) (Temporal)    Wt 156 lb 12.8 oz (71.1 kg)    SpO2 98%    BMI 29.63 kg/m  Wt Readings from Last 3 Encounters:  12/26/21 156 lb 12.8 oz (71.1 kg)  11/27/20 162 lb 6.4 oz (73.7 kg)  05/21/20 161 lb 3.2 oz (73.1 kg)    Health Maintenance Due  Topic Date Due   TETANUS/TDAP  Never done   PAP SMEAR-Modifier  Never done   MAMMOGRAM  Never done   INFLUENZA VACCINE  07/08/2021    There are no  preventive care reminders to display for this  patient.   Lab Results  Component Value Date   TSH 1.41 10/10/2019   Lab Results  Component Value Date   WBC 5.3 10/10/2019   HGB 13.7 10/10/2019   HCT 41.9 10/10/2019   MCV 89.6 10/10/2019   PLT 299.0 10/10/2019   Lab Results  Component Value Date   NA 142 10/10/2019   K 4.1 10/10/2019   CO2 30 10/10/2019   GLUCOSE 91 10/10/2019   BUN 14 10/10/2019   CREATININE 0.50 10/10/2019   BILITOT 0.5 10/10/2019   ALKPHOS 88 10/10/2019   AST 16 10/10/2019   ALT 16 10/10/2019   PROT 6.8 10/10/2019   ALBUMIN 4.1 10/10/2019   CALCIUM 9.0 10/10/2019   GFR 126.80 10/10/2019   Lab Results  Component Value Date   CHOL 217 (H) 08/28/2020   Lab Results  Component Value Date   HDL 74.30 08/28/2020   Lab Results  Component Value Date   LDLCALC 130 (H) 08/28/2020   Lab Results  Component Value Date   TRIG 65.0 08/28/2020   Lab Results  Component Value Date   CHOLHDL 3 08/28/2020   No results found for: HGBA1C     Assessment & Plan:   Problem List Items Addressed This Visit   None Visit Diagnoses     Benign paroxysmal positional vertigo of right ear    -  Primary   Discussed modified epley manuever for treatment. Meclizine and zofran prn. Follow up if symptoms not imporving and will refer for vestibular PT   Relevant Medications   meclizine (ANTIVERT) 12.5 MG tablet        Meds ordered this encounter  Medications   meclizine (ANTIVERT) 12.5 MG tablet    Sig: Take 1 tablet (12.5 mg total) by mouth 2 (two) times daily as needed for dizziness (do not use for more than 3days).    Dispense:  6 tablet    Refill:  0   ondansetron (ZOFRAN-ODT) 4 MG disintegrating tablet    Sig: Take 1 tablet (4 mg total) by mouth every 8 (eight) hours as needed for nausea or vomiting.    Dispense:  20 tablet    Refill:  0     Gerre Scull, NP

## 2021-12-26 NOTE — Patient Instructions (Signed)
It was great to see you!  Do the maneuver once a day until your symptoms resolve that we discussed in the office  You can take meclizine and zofran as needed for nausea  If your symptoms don't get better in the next few days, call our office or send a mychart message and I will place a referral to PT.   Please call and schedule an appointment for a physical with [facility] when able.   Take care,  Rodman Pickle, NP

## 2021-12-26 NOTE — Telephone Encounter (Signed)
Pt stopped by the front after appt with Lauren and said that [place] had increased her rosuvastatin (CRESTOR) from 10mg  to 20mg , she said she wanted to get labs done to see if it has been working or not, Can labs be scheduled for this or does she need to be seen again first?

## 2021-12-27 NOTE — Telephone Encounter (Signed)
Please schedule appointment for patient, she has not been seen in over a year.

## 2021-12-30 NOTE — Telephone Encounter (Signed)
Lvm for pt to call back and schedule.  

## 2021-12-31 ENCOUNTER — Other Ambulatory Visit: Payer: Self-pay | Admitting: Nurse Practitioner

## 2021-12-31 DIAGNOSIS — H8111 Benign paroxysmal vertigo, right ear: Secondary | ICD-10-CM

## 2022-01-21 ENCOUNTER — Telehealth: Payer: Self-pay | Admitting: Nurse Practitioner

## 2022-01-21 NOTE — Telephone Encounter (Signed)
Pt has private insurance,  Pt last visit in January she had her vitals checked last visit. Up to 10 mg to 20 mg, can it be approved first?  Pt wants to know can it be re-prescribed the meclizine meclizine (ANTIVERT) 12.5 MG tablet

## 2022-05-26 ENCOUNTER — Ambulatory Visit (INDEPENDENT_AMBULATORY_CARE_PROVIDER_SITE_OTHER): Payer: No Typology Code available for payment source | Admitting: Nurse Practitioner

## 2022-05-26 ENCOUNTER — Encounter: Payer: Self-pay | Admitting: Nurse Practitioner

## 2022-05-26 VITALS — BP 110/58 | HR 79 | Temp 98.0°F | Ht 61.0 in | Wt 162.4 lb

## 2022-05-26 DIAGNOSIS — Z1211 Encounter for screening for malignant neoplasm of colon: Secondary | ICD-10-CM

## 2022-05-26 DIAGNOSIS — E782 Mixed hyperlipidemia: Secondary | ICD-10-CM | POA: Diagnosis not present

## 2022-05-26 LAB — COMPREHENSIVE METABOLIC PANEL
ALT: 19 U/L (ref 0–35)
AST: 21 U/L (ref 0–37)
Albumin: 4.1 g/dL (ref 3.5–5.2)
Alkaline Phosphatase: 74 U/L (ref 39–117)
BUN: 10 mg/dL (ref 6–23)
CO2: 28 mEq/L (ref 19–32)
Calcium: 9.3 mg/dL (ref 8.4–10.5)
Chloride: 107 mEq/L (ref 96–112)
Creatinine, Ser: 0.62 mg/dL (ref 0.40–1.20)
GFR: 96.77 mL/min (ref >60.00)
Glucose, Bld: 99 mg/dL (ref 70–99)
Potassium: 4.9 mEq/L (ref 3.5–5.1)
Sodium: 142 mEq/L (ref 135–145)
Total Bilirubin: 0.6 mg/dL (ref 0.2–1.2)
Total Protein: 7 g/dL (ref 6.0–8.3)

## 2022-05-26 LAB — LIPID PANEL
Cholesterol: 201 mg/dL — ABNORMAL HIGH (ref 0–200)
HDL: 72 mg/dL (ref >39.00)
LDL Cholesterol: 119 mg/dL — ABNORMAL HIGH (ref 0–99)
NonHDL: 128.5
Total CHOL/HDL Ratio: 3
Triglycerides: 49 mg/dL (ref 0.0–149.0)
VLDL: 9.8 mg/dL (ref 0.0–40.0)

## 2022-05-26 NOTE — Progress Notes (Signed)
Established Patient Visit  Patient: Wendy Shaw   DOB: [birth year]   61 y.o. Female  MRN: [id number] Visit Date: 05/27/2022  Subjective:    Chief Complaint  Patient presents with   office visit    F/u on cholesterol medication   HPI Hyperlipidemia Admits to need to improve diet and minimal exercise. Mild myalgia with current crestor dose (20mg )  Advised about the importance of heart healthy diet and daily exercise. Repeat CMP (normal) and lipid panel (improved TC and LDL, but not at goal) 29yrs ASCVD risk at 2.1%% Maintain crestor dose  Osteopenia Current use of calcium and vit/ D supplement Needs repeat dexa scan  She agreed to repeat Cologuard results.  BP Readings from Last 3 Encounters:  05/26/22 (!) 110/58  12/26/21 109/66  11/27/20 116/70    Reviewed medical, surgical, and social history today  Medications: Outpatient Medications Prior to Visit  Medication Sig   CALCIUM-VITAMIN D PO Take 1,000 mg by mouth daily. 2 tab daily,Calcium, magnesium & Zinc   meclizine (ANTIVERT) 12.5 MG tablet TAKE 1 TABLET(12.5 MG) BY MOUTH TWICE DAILY AS NEEDED FOR DIZZINESS. DO NOT USE FOR MORE THAN 3 DAYS   Multiple Vitamin (MULTIVITAMIN) capsule Take 1 capsule by mouth daily.   Omega-3 Fatty Acids (FISH OIL PO) Take 1,000 mg by mouth daily at 2 PM. 2 tab daily   rosuvastatin (CRESTOR) 20 MG tablet TAKE 1 TABLET(20 MG) BY MOUTH DAILY   Specialty Vitamins Products (BIOTIN PLUS KERATIN PO) Take 100 mg by mouth daily at 2 PM.    [DISCONTINUED] metoprolol tartrate (LOPRESSOR) 50 MG tablet Take 1 tablet by mouth 2 hours prior to Cardiac CT   [DISCONTINUED] ondansetron (ZOFRAN-ODT) 4 MG disintegrating tablet Take 1 tablet (4 mg total) by mouth every 8 (eight) hours as needed for nausea or vomiting.   No facility-administered medications prior to visit.   Reviewed past medical and social history.   ROS per HPI above      Objective:  BP (!) 110/58 (BP Location: Right  Arm, Patient Position: Sitting, Cuff Size: Normal)   Pulse 79   Temp 98 F (36.7 C) (Temporal)   Ht 5\' 1"  (1.549 m)   Wt 162 lb 6.4 oz (73.7 kg)   SpO2 95%   BMI 30.69 kg/m      Physical Exam Cardiovascular:     Rate and Rhythm: Normal rate.     Pulses: Normal pulses.  Pulmonary:     Effort: Pulmonary effort is normal.  Neurological:     Mental Status: She is alert and oriented to person, place, and time.     Results for orders placed or performed in visit on 05/26/22  Lipid panel  Result Value Ref Range   Cholesterol 201 (H) 0 - 200 mg/dL   Triglycerides 0.0 - 149.0 mg/dL   HDL 05/28/22 79.0 mg/dL   VLDL 9.8 0.0 - 24.09 mg/dL   LDL Cholesterol >73.53 (H) 0 - 99 mg/dL   Total CHOL/HDL Ratio 3    NonHDL 128.50   Comprehensive metabolic panel  Result Value Ref Range   Sodium 142 135 - 145 mEq/L   Potassium 4.9 3.5 - 5.1 mEq/L   Chloride 107 96 - 112 mEq/L   CO2 28 19 - 32 mEq/L   Glucose, Bld 99 70 - 99 mg/dL   BUN 10 6 - 23 mg/dL   Creatinine, Ser 29.9 0.40 - 1.20  mg/dL   Total Bilirubin 0.6 0.2 - 1.2 mg/dL   Alkaline Phosphatase 74 39 - 117 U/L   AST 21 0 - 37 U/L   ALT 19 0 - 35 U/L   Total Protein 7.0 6.0 - 8.3 g/dL   Albumin 4.1 3.5 - 5.2 g/dL   GFR 26.71 >24.58 mL/min   Calcium 9.3 8.4 - 10.5 mg/dL      Assessment & Plan:    Problem List Items Addressed This Visit       Other   Hyperlipidemia - Primary    Admits to need to improve diet and minimal exercise. Mild myalgia with current crestor dose (20mg )  Advised about the importance of heart healthy diet and daily exercise. Repeat CMP (normal) and lipid panel (improved TC and LDL, but not at goal) 29yrs ASCVD risk at 2.1%% Maintain crestor dose      Relevant Orders   Lipid panel (Completed)   Comprehensive metabolic panel (Completed)   Other Visit Diagnoses     Colon cancer screening       Relevant Orders   Cologuard      Return in about 6 months (around 11/25/2022) for CPE  (fasting).     11/27/2022, NP

## 2022-05-26 NOTE — Patient Instructions (Addendum)
You will be contacted about cologuard order. Sign medical release form to get records from GYN (PAP and mammogram) Go to lab Start heart healthy diet and daily exercise ( of moderate exercise per week).  Preventing High Cholesterol Cholesterol is a white, waxy substance similar to fat that the human body needs to help build cells. The liver makes all the cholesterol that a person's body needs. Having high cholesterol (hypercholesterolemia) increases your risk for heart disease and stroke. Extra or excess cholesterol comes from the food that you eat. High cholesterol can often be prevented with diet and lifestyle changes. If you already have high cholesterol, you can control it with diet, lifestyle changes, and medicines. How can high cholesterol affect me? If you have high cholesterol, fatty deposits (plaques) may build up on the walls of your blood vessels. The blood vessels that carry blood away from your heart are called arteries. Plaques make the arteries narrower and stiffer. This in turn can: Restrict or block blood flow and cause blood clots to form. Increase your risk for heart attack and stroke. What can increase my risk for high cholesterol? This condition is more likely to develop in people who: Eat foods that are high in saturated fat or cholesterol. Saturated fat is mostly found in foods that come from animal sources. Are overweight. Are not getting enough exercise. Use products that contain nicotine or tobacco, such as cigarettes, e-cigarettes, and chewing tobacco. Have a family history of high cholesterol (familial hypercholesterolemia). What actions can I take to prevent this? Nutrition  Eat less saturated fat. Avoid trans fats (partially hydrogenated oils). These are often found in margarine and in some baked goods, fried foods, and snacks bought in packages. Avoid precooked or cured meat, such as bacon, sausages, or meat loaves. Avoid foods and drinks that have added  sugars. Eat more fruits, vegetables, and whole grains. Choose healthy sources of protein, such as fish, poultry, lean cuts of red meat, beans, peas, lentils, and nuts. Choose healthy sources of fat, such as: Nuts. Vegetable oils, especially olive oil. Fish that have healthy fats, such as omega-3 fatty acids. These fish include mackerel or salmon. Lifestyle Lose weight if you are overweight. Maintaining a healthy body mass index (BMI) can help prevent or control high cholesterol. It can also lower your risk for diabetes and high blood pressure. Ask your health care provider to help you with a diet and exercise plan to lose weight safely. Do not use any products that contain nicotine or tobacco. These products include cigarettes, chewing tobacco, and vaping devices, such as e-cigarettes. If you need help quitting, ask your health care provider. Alcohol use Do not drink alcohol if: Your health care provider tells you not to drink. You are pregnant, may be pregnant, or are planning to become pregnant. If you drink alcohol: Limit how much you have to: 0-1 drink a day for women. 0-2 drinks a day for men. Know how much alcohol is in your drink. In the [place] one drink equals one 12 oz bottle of beer (355 mL), one 5 oz glass of wine (148 mL), or one 1 oz glass of hard liquor (44 mL). Activity  Get enough exercise. Do exercises as told by your health care provider. Each week, do at least 150 minutes of exercise that takes a medium level of effort (moderate-intensity exercise). This kind of exercise: Makes your heart beat faster while allowing you to still be able to talk. Can be done in short sessions several times  a day or longer sessions a few times a week. For example, on 5 days each week, you could walk fast or ride your bike 3 times a day for 10 minutes each time. Medicines Your health care provider may recommend medicines to help lower cholesterol. This may be a medicine to lower the amount of  cholesterol that your liver makes. You may need medicine if: Diet and lifestyle changes have not lowered your cholesterol enough. You have high cholesterol and other risk factors for heart disease or stroke. Take over-the-counter and prescription medicines only as told by your health care provider. General information Manage your risk factors for high cholesterol. Talk with your health care provider about all your risk factors and how to lower your risk. Manage other conditions that you have, such as diabetes or high blood pressure (hypertension). Have blood tests to check your cholesterol levels at regular points in time as told by your health care provider. Keep all follow-up visits. This is important. Where to find more information American Heart Association: www.heart.org National Heart, Lung, and Blood Institute: [employer].is Summary High cholesterol increases your risk for heart disease and stroke. By keeping your cholesterol level low, you can reduce your risk for these conditions. High cholesterol can often be prevented with diet and lifestyle changes. Work with your health care provider to manage your risk factors, and have your blood tested regularly. This information is not intended to replace advice given to you by your health care provider. Make sure you discuss any questions you have with your health care provider. Document Revised: 01/28/2021 Document Reviewed: 01/28/2021 Elsevier Patient Education  2023 [employer].

## 2022-05-26 NOTE — Assessment & Plan Note (Addendum)
Current use of calcium and vit/ D supplement Needs repeat dexa scan

## 2022-05-26 NOTE — Assessment & Plan Note (Addendum)
Admits to poor diet and minimal exercise. Mild myalgia with current crestor dose (20mg )  Advised about the importance of heart healthy diet and daily exercise. Repeat CMP (normal) and lipid panel (improved TC and LDL, but not at goal) 46yrs ASCVD risk at 2.1%% Maintain crestor dose

## 2022-05-27 ENCOUNTER — Encounter: Payer: Self-pay | Admitting: Nurse Practitioner

## 2022-12-09 ENCOUNTER — Other Ambulatory Visit: Payer: Self-pay | Admitting: Nurse Practitioner

## 2022-12-09 NOTE — Telephone Encounter (Signed)
Chart supports Rx Last OV: 05/2022 Next OV: 01/2023  

## 2023-01-12 ENCOUNTER — Encounter: Payer: No Typology Code available for payment source | Admitting: Nurse Practitioner

## 2023-01-22 ENCOUNTER — Ambulatory Visit (INDEPENDENT_AMBULATORY_CARE_PROVIDER_SITE_OTHER): Payer: No Typology Code available for payment source | Admitting: Nurse Practitioner

## 2023-01-22 ENCOUNTER — Encounter: Payer: Self-pay | Admitting: Nurse Practitioner

## 2023-01-22 VITALS — BP 110/86 | HR 86 | Temp 98.2°F | Resp 16 | Ht 61.0 in | Wt 161.8 lb

## 2023-01-22 DIAGNOSIS — Z1211 Encounter for screening for malignant neoplasm of colon: Secondary | ICD-10-CM | POA: Diagnosis not present

## 2023-01-22 DIAGNOSIS — Z0000 Encounter for general adult medical examination without abnormal findings: Secondary | ICD-10-CM

## 2023-01-22 DIAGNOSIS — R011 Cardiac murmur, unspecified: Secondary | ICD-10-CM

## 2023-01-22 DIAGNOSIS — Z0001 Encounter for general adult medical examination with abnormal findings: Secondary | ICD-10-CM

## 2023-01-22 DIAGNOSIS — E782 Mixed hyperlipidemia: Secondary | ICD-10-CM

## 2023-01-22 DIAGNOSIS — M8580 Other specified disorders of bone density and structure, unspecified site: Secondary | ICD-10-CM

## 2023-01-22 NOTE — Assessment & Plan Note (Signed)
Asymptomatic  No need for echo at this time

## 2023-01-22 NOTE — Progress Notes (Signed)
Complete physical exam  Patient: Wendy Shaw   DOB: 03/08/1961   62 y.o. Female  MRN: [id number] Visit Date: 01/22/2023  Subjective:    Chief Complaint  Patient presents with   Annual Exam    Cpe - not fasting     Wendy Shaw is a 62 y.o. female who presents today for a complete physical exam. She reports consuming a general diet.  Training and walking 2-3x/week  She generally feels well. She reports sleeping well. She does not have additional problems to discuss today.  Vision:Yes Dental:Yes STD Screen:No  BP Readings from Last 3 Encounters:  01/22/23 110/86  05/26/22 (!) 110/58  12/26/21 109/66   Wt Readings from Last 3 Encounters:  01/22/23 161 lb 12.8 oz (73.4 kg)  05/26/22 162 lb 6.4 oz (73.7 kg)  12/26/21 156 lb 12.8 oz (71.1 kg)   Most recent fall risk assessment:    01/22/2023   11:15 AM  [place] in the past year? 0  Number falls in past yr: 0  Injury with Fall? 0  Risk for fall due to : No Fall Risks  Follow up Falls evaluation completed   Depression screen:Yes - No Depression  Most recent depression screenings:    01/22/2023   11:15 AM 05/26/2022    8:26 AM  PHQ 2/9 Scores  PHQ - 2 Score 0 0    HPI  Hyperlipidemia Repeat lipid panel Maintain crestor dose  Heart murmur Asymptomatic  No need for echo at this time   Past Medical History:  Diagnosis Date   Amenorrhea    secondary to menopause   Chest pressure    Family history of breast cancer    Family history of heart disease    Family history of ovarian cancer    Heart murmur    Hyperlipidemia    Osteopenia    Post-menopausal    Systolic ejection murmur    Tachycardia    Past Surgical History:  Procedure Laterality Date   CESAREAN SECTION  2005   Social History   Socioeconomic History   Marital status: Married    Spouse name: Not on file   Number of children: 2   Years of education: Not on file   Highest education level: Not on file  Occupational History     Comment: Housewife  Tobacco Use   Smoking status: Never   Smokeless tobacco: Never  Vaping Use   Vaping Use: Never used  Substance and Sexual Activity   Alcohol use: Yes    Comment: social   Drug use: No   Sexual activity: Yes    Birth control/protection: Post-menopausal  Other Topics Concern   Not on file  Social History Narrative   Not on file   Social Determinants of Health   Financial Resource Strain: Not on file  Food Insecurity: Not on file  Transportation Needs: Not on file  Physical Activity: Not on file  Stress: Not on file  Social Connections: Not on file  Intimate Partner Violence: Not on file   Family Status  Relation Name Status   Mother  Alive   Father  Deceased   MGF  (Not Specified)   Sister  (Not Specified)   Mat Aunt  (Not Specified)   Family History  Problem Relation Age of Onset   Hyperlipidemia Mother    Arthritis Mother    Cancer Father        prostate cancer, 64   Arthritis Father  Hypertension Father    Heart attack Father    Cancer Maternal Grandfather        lung cancer   Breast cancer Sister        60   Ovarian cancer Maternal Aunt        5   Allergies  Allergen Reactions   Pollen Extract Other (See Comments)    Allergy,sinus    Patient Care Team: Cline Draheim, Charlene Brooke, NP as PCP - General (Internal Medicine) Jettie Booze, MD as PCP - Cardiology (Cardiology)   Medications: Outpatient Medications Prior to Visit  Medication Sig   CALCIUM-VITAMIN D PO Take 1,000 mg by mouth daily. 2 tab daily,Calcium, magnesium & Zinc   meclizine (ANTIVERT) 12.5 MG tablet TAKE 1 TABLET(12.5 MG) BY MOUTH TWICE DAILY AS NEEDED FOR DIZZINESS. DO NOT USE FOR MORE THAN 3 DAYS   Multiple Vitamin (MULTIVITAMIN) capsule Take 1 capsule by mouth daily.   Omega-3 Fatty Acids (FISH OIL PO) Take 1,000 mg by mouth daily at 2 PM. 2 tab daily   rosuvastatin (CRESTOR) 20 MG tablet TAKE 1 TABLET(20 MG) BY MOUTH DAILY   Specialty Vitamins Products  (BIOTIN PLUS KERATIN PO) Take 100 mg by mouth daily at 2 PM.    No facility-administered medications prior to visit.    Review of Systems  Constitutional:  Negative for activity change, appetite change and unexpected weight change.  Respiratory: Negative.    Cardiovascular: Negative.   Gastrointestinal: Negative.   Endocrine: Negative for cold intolerance and heat intolerance.  Genitourinary: Negative.   Musculoskeletal: Negative.   Skin: Negative.   Neurological: Negative.   Hematological: Negative.   Psychiatric/Behavioral:  Negative for behavioral problems, decreased concentration, dysphoric mood, hallucinations, self-injury, sleep disturbance and suicidal ideas. The patient is not nervous/anxious.    Last metabolic panel Lab Results  Component Value Date   GLUCOSE 99 05/26/2022   NA 142 05/26/2022   K 4.9 05/26/2022   CL 107 05/26/2022   CO2 28 05/26/2022   BUN 10 05/26/2022   CREATININE 0.62 05/26/2022   CALCIUM 9.3 05/26/2022   PROT 7.0 05/26/2022   ALBUMIN 4.1 05/26/2022   BILITOT 0.6 05/26/2022   ALKPHOS 74 05/26/2022   AST 21 05/26/2022   ALT 19 05/26/2022   Last lipids Lab Results  Component Value Date   CHOL 201 (H) 05/26/2022   HDL 72.00 05/26/2022   LDLCALC 119 (H) 05/26/2022   TRIG 49.0 05/26/2022   CHOLHDL 3 05/26/2022        Objective:  BP 110/86 (BP Location: Left Arm, Patient Position: Sitting, Cuff Size: Normal)   Pulse 86   Temp 98.2 F (36.8 C) (Temporal)   Resp 16   Ht 5' 1"$  (1.549 m)   Wt 161 lb 12.8 oz (73.4 kg)   SpO2 95%   BMI 30.57 kg/m     Physical Exam Vitals and nursing note reviewed.  Constitutional:      General: She is not in acute distress. HENT:     Right Ear: Tympanic membrane, ear canal and external ear normal.     Left Ear: Tympanic membrane, ear canal and external ear normal.     Nose: Nose normal.  Eyes:     Extraocular Movements: Extraocular movements intact.     Conjunctiva/sclera: Conjunctivae normal.      Pupils: Pupils are equal, round, and reactive to light.  Neck:     Thyroid: No thyroid mass, thyromegaly or thyroid tenderness.  Cardiovascular:     Rate  and Rhythm: Normal rate and regular rhythm.     Pulses: Normal pulses.     Heart sounds: Normal heart sounds.  Pulmonary:     Effort: Pulmonary effort is normal.     Breath sounds: Normal breath sounds.  Abdominal:     General: Bowel sounds are normal.     Palpations: Abdomen is soft.  Genitourinary:    Comments: Deferred breast and pelvic exam to GYN Musculoskeletal:        General: Normal range of motion.     Cervical back: Normal range of motion and neck supple.     Right lower leg: No edema.     Left lower leg: No edema.  Lymphadenopathy:     Cervical: No cervical adenopathy.  Skin:    General: Skin is warm and dry.  Neurological:     Mental Status: She is alert and oriented to person, place, and time.     Cranial Nerves: No cranial nerve deficit.  Psychiatric:        Mood and Affect: Mood normal.        Behavior: Behavior normal.        Thought Content: Thought content normal.     No results found for any visits on 01/22/23.    Assessment & Plan:    Routine Health Maintenance and Physical Exam  Immunization History  Administered Date(s) Administered   Influenza,inj,Quad PF,6+ Mos 10/23/2018, 10/10/2019   Influenza,inj,quad, With Preservative 10/23/2018   Zoster Recombinat (Shingrix) 10/10/2019, 12/15/2019   Health Maintenance  Topic Date Due   DTaP/Tdap/Td (1 - Tdap) Never done   PAP SMEAR-Modifier  Never done   MAMMOGRAM  Never done   Fecal DNA (Cologuard)  05/26/2022   COVID-19 Vaccine (1) 02/07/2023 (Originally 06/08/1961)   INFLUENZA VACCINE  03/08/2023 (Originally 07/08/2022)   Hepatitis C Screening  Completed   HIV Screening  Completed   Zoster Vaccines- Shingrix  Completed   HPV VACCINES  Aged Out   Discussed health benefits of physical activity, and encouraged her to engage in regular exercise  appropriate for her age and condition. Requested mammogram and PAP report from GYN.   Problem List Items Addressed This Visit       Musculoskeletal and Integument   Osteopenia   Relevant Orders   DG Bone Density     Other   Heart murmur    Asymptomatic  No need for echo at this time      Hyperlipidemia    Repeat lipid panel Maintain crestor dose      Relevant Orders   Lipid panel   Other Visit Diagnoses     Encounter for preventative adult health care exam with abnormal findings    -  Primary   Relevant Orders   DG Bone Density   Cologuard   Comprehensive metabolic panel   Colon cancer screening       Relevant Orders   Cologuard      Return in about 1 year (around 01/23/2024) for hyperlipidemia (fasting).     Wilfred Lacy, NP

## 2023-01-22 NOTE — Assessment & Plan Note (Signed)
Repeat lipid panel Maintain crestor dose

## 2023-01-22 NOTE — Patient Instructions (Addendum)
Sign medical release to get records from GYN. Schedule fasting lab appt. Need to be fasting 8hrs prior to blood draw. Ok to drink water and take BP meds. Continue Heart healthy diet and daily exercise. Maintain current medication.

## 2023-02-27 ENCOUNTER — Other Ambulatory Visit: Payer: No Typology Code available for payment source

## 2023-06-17 ENCOUNTER — Other Ambulatory Visit (HOSPITAL_BASED_OUTPATIENT_CLINIC_OR_DEPARTMENT_OTHER): Payer: Self-pay | Admitting: Obstetrics and Gynecology

## 2023-06-17 DIAGNOSIS — Z8249 Family history of ischemic heart disease and other diseases of the circulatory system: Secondary | ICD-10-CM

## 2023-06-24 ENCOUNTER — Ambulatory Visit: Payer: No Typology Code available for payment source | Admitting: Internal Medicine

## 2023-07-02 ENCOUNTER — Encounter: Payer: Self-pay | Admitting: Nurse Practitioner

## 2023-08-12 ENCOUNTER — Ambulatory Visit (HOSPITAL_BASED_OUTPATIENT_CLINIC_OR_DEPARTMENT_OTHER)
Admission: RE | Admit: 2023-08-12 | Discharge: 2023-08-12 | Disposition: A | Discharge status: Home / Self Care | Payer: No Typology Code available for payment source | Source: Ambulatory Visit | Attending: Obstetrics and Gynecology | Admitting: Obstetrics and Gynecology

## 2023-08-12 DIAGNOSIS — Z8249 Family history of ischemic heart disease and other diseases of the circulatory system: Secondary | ICD-10-CM | POA: Insufficient documentation

## 2023-08-21 LAB — COLOGUARD: COLOGUARD: NEGATIVE

## 2023-10-09 ENCOUNTER — Other Ambulatory Visit (INDEPENDENT_AMBULATORY_CARE_PROVIDER_SITE_OTHER): Payer: No Typology Code available for payment source

## 2023-10-09 DIAGNOSIS — M8580 Other specified disorders of bone density and structure, unspecified site: Secondary | ICD-10-CM

## 2023-10-09 DIAGNOSIS — Z0001 Encounter for general adult medical examination with abnormal findings: Secondary | ICD-10-CM | POA: Diagnosis not present

## 2023-10-09 DIAGNOSIS — E782 Mixed hyperlipidemia: Secondary | ICD-10-CM | POA: Diagnosis not present

## 2023-10-09 DIAGNOSIS — Z1211 Encounter for screening for malignant neoplasm of colon: Secondary | ICD-10-CM

## 2023-10-09 LAB — LIPID PANEL
Cholesterol: 196 mg/dL (ref 0–200)
HDL: 73.3 mg/dL (ref >39.00)
LDL Cholesterol: 114 mg/dL — ABNORMAL HIGH (ref 0–99)
NonHDL: 122.92
Total CHOL/HDL Ratio: 3
Triglycerides: 45 mg/dL (ref 0.0–149.0)
VLDL: 9 mg/dL (ref 0.0–40.0)

## 2023-10-09 LAB — COMPREHENSIVE METABOLIC PANEL
ALT: 20 U/L (ref 0–35)
AST: 18 U/L (ref 0–37)
Albumin: 4.1 g/dL (ref 3.5–5.2)
Alkaline Phosphatase: 73 U/L (ref 39–117)
BUN: 13 mg/dL (ref 6–23)
CO2: 30 meq/L (ref 19–32)
Calcium: 9.1 mg/dL (ref 8.4–10.5)
Chloride: 103 meq/L (ref 96–112)
Creatinine, Ser: 0.57 mg/dL (ref 0.40–1.20)
GFR: 97.8 mL/min (ref >60.00)
Glucose, Bld: 71 mg/dL (ref 70–99)
Potassium: 3.8 meq/L (ref 3.5–5.1)
Sodium: 141 meq/L (ref 135–145)
Total Bilirubin: 0.6 mg/dL (ref 0.2–1.2)
Total Protein: 6.7 g/dL (ref 6.0–8.3)

## 2023-10-26 NOTE — Progress Notes (Deleted)
  Cardiology Office Note:  .   Date:  10/26/2023 ID:  Wendy Shaw, DOB 1961/09/23, MRN [id number] PCP: Anne Ng, NP [facility] Providers Cardiologist:  None { Click to update primary MD,subspecialty MD or APP then REFRESH:1}   Patient Profile: .      PMH Coronary artery disease CT Calcium Score 08/26/23 CAC score 46 (81st percentile) LM 0, LAD 46, LCx 0, RCA 0 Hyperlipidemia       History of Present Illness: .   Wendy Shaw is a *** 62 y.o. female  who is here today for new patient consult for elevated coronary artery calcium score on CT 08/26/23.   Family history: Her family history includes Arthritis in her father and mother; Breast cancer in her sister; Cancer in her father and maternal grandfather; Heart attack in her father; Hyperlipidemia in her mother and sister; Hypertension in her father; Ovarian cancer in her maternal aunt.   ASCVD Risk Score: The 10-year ASCVD risk score (Arnett DK, et al., 2019) is: 2.3%   Values used to calculate the score:     Age: 61 years     Sex: Female     Is Non-Hispanic African American: No     Diabetic: No     Tobacco smoker: No     Systolic Blood Pressure: 112 mmHg     Is BP treated: No     HDL Cholesterol: 73.3 mg/dL     Total Cholesterol: 196 mg/dL  {MD Calc ASCVD Calculator :1}  Diet:  Activity:  ROS: ***       Studies Reviewed: .         CT Calcium Score 08/26/23 Coronary arteries: Normal origins.   Coronary Calcium Score:   Left main:   Left anterior descending artery: 46   Left circumflex artery:   Right coronary artery:   Total: 46   Percentile: 81   Pericardium: Normal.   Ascending Aorta: Normal caliber.   Non-cardiac: See separate report from [facility].   IMPRESSION: Coronary calcium score of 46. This was 81st percentile for age-, race-, and sex-matched controls. Risk Assessment/Calculations:   {Does this patient have ATRIAL FIBRILLATION?:[phone]} No BP recorded.   {Refresh Note OR Click here to enter BP  :1}***       Physical Exam:   VS: There were no vitals taken for this visit.  Wt Readings from Last 3 Encounters:  01/22/23 161 lb 12.8 oz (73.4 kg)  05/26/22 162 lb 6.4 oz (73.7 kg)  12/26/21 156 lb 12.8 oz (71.1 kg)     GEN: Well nourished, well developed in no acute distress NECK: No JVD; No carotid bruits CARDIAC: ***RRR, no murmurs, rubs, gallops RESPIRATORY:  Clear to auscultation without rales, wheezing or rhonchi  ABDOMEN: Soft, non-tender, non-distended EXTREMITIES:  No edema; No deformity     ASSESSMENT AND PLAN: .    Coronary artery disease: Elevated coronary calcium score of 46 with all calcification in LAD.   Hyperlipidemia: Lipid panel 10/09/23: total cholesterol 196, triglycerides 45, HDL 73.3, LDL 114  CV Risk Assessment  Plan/Goals:  Activity: ***      {Are you ordering a CV Procedure (e.g. stress test, cath, DCCV, TEE, etc)?   Press F2        :[id number]}  Dispo: ***  Signed, Eligha Bridegroom, NP-C

## 2023-10-28 ENCOUNTER — Institutional Professional Consult (permissible substitution) (HOSPITAL_BASED_OUTPATIENT_CLINIC_OR_DEPARTMENT_OTHER): Payer: No Typology Code available for payment source | Admitting: Nurse Practitioner

## 2023-11-17 NOTE — Progress Notes (Unsigned)
  Cardiology Office Note:  .   Date:  11/17/2023 ID:  [name], DOB 06/25/1961, MRN [id number] PCP: Anne Ng, NP [facility] Providers Cardiologist:  None { Click to update primary MD,subspecialty MD or APP then REFRESH:1}   Patient Profile: .      PMH Coronary artery disease CT Calcium Score 08/26/23 CAC score 46 (81st percentile) LM 0, LAD 46, LCx 0, RCA 0 Hyperlipidemia       History of Present Illness: .   Wendy Shaw is a *** 62 y.o. female  who is here today for new patient consult for elevated coronary artery calcium score on CT 08/26/23.   Family history: Her family history includes Arthritis in her father and mother; Breast cancer in her sister; Cancer in her father and maternal grandfather; Heart attack in her father; Hyperlipidemia in her mother and sister; Hypertension in her father; Ovarian cancer in her maternal aunt.   ASCVD Risk Score: The 10-year ASCVD risk score (Arnett DK, et al., 2019) is: 2.3%   Values used to calculate the score:     Age: 42 years     Sex: Female     Is Non-Hispanic African American: No     Diabetic: No     Tobacco smoker: No     Systolic Blood Pressure: 112 mmHg     Is BP treated: No     HDL Cholesterol: 73.3 mg/dL     Total Cholesterol: 196 mg/dL  {MD Calc ASCVD Calculator :1}  Diet:  Activity:  ROS: ***       Studies Reviewed: .         CT Calcium Score 08/26/23 Coronary arteries: Normal origins. Coronary Calcium Score: Left main: Left anterior descending artery: 46 Left circumflex artery: Right coronary artery: Total: 46   Percentile: 81   Pericardium: Normal.   Ascending Aorta: Normal caliber.   Non-cardiac: See separate report from [facility].   IMPRESSION: Coronary calcium score of 46. This was 81st percentile for age-, race-, and sex-matched controls. Risk Assessment/Calculations:   {Does this patient have ATRIAL FIBRILLATION?:[phone]} No BP recorded.  {Refresh Note OR  Click here to enter BP  :1}***       Physical Exam:   VS: There were no vitals taken for this visit.  Wt Readings from Last 3 Encounters:  01/22/23 161 lb 12.8 oz (73.4 kg)  05/26/22 162 lb 6.4 oz (73.7 kg)  12/26/21 156 lb 12.8 oz (71.1 kg)     GEN: Well nourished, well developed in no acute distress NECK: No JVD; No carotid bruits CARDIAC: ***RRR, no murmurs, rubs, gallops RESPIRATORY:  Clear to auscultation without rales, wheezing or rhonchi  ABDOMEN: Soft, non-tender, non-distended EXTREMITIES:  No edema; No deformity     ASSESSMENT AND PLAN: .    Coronary artery disease: Elevated coronary calcium score of 46 with all calcification in LAD.   Hyperlipidemia: Lipid panel 10/09/23: total cholesterol 196, triglycerides 45, HDL 73.3, LDL 114  CV Risk Assessment  Plan/Goals:  Activity: ***      {Are you ordering a CV Procedure (e.g. stress test, cath, DCCV, TEE, etc)?   Press F2        :[id number]}  Dispo: ***  Signed, Eligha Bridegroom, NP-C

## 2023-11-18 ENCOUNTER — Institutional Professional Consult (permissible substitution) (HOSPITAL_BASED_OUTPATIENT_CLINIC_OR_DEPARTMENT_OTHER): Payer: No Typology Code available for payment source | Admitting: Nurse Practitioner

## 2023-12-17 ENCOUNTER — Other Ambulatory Visit: Payer: Self-pay | Admitting: Nurse Practitioner

## 2023-12-30 ENCOUNTER — Institutional Professional Consult (permissible substitution) (HOSPITAL_BASED_OUTPATIENT_CLINIC_OR_DEPARTMENT_OTHER): Payer: No Typology Code available for payment source | Admitting: Nurse Practitioner

## 2024-02-03 ENCOUNTER — Institutional Professional Consult (permissible substitution) (HOSPITAL_BASED_OUTPATIENT_CLINIC_OR_DEPARTMENT_OTHER): Payer: No Typology Code available for payment source | Admitting: Nurse Practitioner

## 2024-04-04 NOTE — Progress Notes (Signed)
 Cardiology Office Note:  .   Date:  04/07/2024 ID:  Jessie Morning, DOB 1961-10-10, MRN [id number] PCP: Kandace Organ, NP [facility] Providers Cardiologist:  None   Patient Profile: .      PMH Coronary artery disease CT Calcium  score 08/12/2023 CAC Score 46 (81st percentile) LM 0, LAD 46, LCx 0, RCA 0 Hyperlipidemia Heart murmur Family history of heart disease Hyperlipidemia  Seen in cardiology 05/2020       History of Present Illness: .    History of Present Illness Wendy Shaw "Wendy Shaw" is a very pleasant 63 year old female who presents for evaluation of coronary calcification found on a CT scan. She underwent CT scan for risk stratification given family history and history of high cholesterol for 15 years. She reported prior intolerance to statin due to previous side effects like joint stiffness, unclear if this was one prior statin or two but she is tolerating rosuvastatin  without concerning side effects. Family history is significant for heart disease. Her father experienced MI in his 2s, and her mother was diagnosed with angina in her late forties or early fifties and has been on cholesterol medication for over 40 years. Patient has no history of diabetes, hypertension, or smoking. She has made dietary changes and increased exercise, including regular dog walking and weight training, to manage cholesterol levels.She denies chest pain, shortness of breath, lower extremity edema, fatigue, palpitations, presyncope, syncope, orthopnea, and PND.  She reported history of heart murmur heard on prior exam, but no prior diagnosis.   Family history: Her family history includes Arthritis in her father and mother; Breast cancer in her sister; Cancer in her father and maternal grandfather; Heart attack in her father; Hyperlipidemia in her mother and sister; Hypertension in her father; Ovarian cancer in her maternal aunt.  Father had MI in his 66s, was a truck driver so did not  exercise or eat healthy Mother living age 50, hx high cholesterol   Discussed the use of AI scribe software for clinical note transcription with the patient, who gave verbal consent to proceed.  ASCVD Risk Score: The 10-year ASCVD risk score (Arnett DK, et al., 2019) is: 2.5%   Values used to calculate the score:     Age: 38 years     Sex: Female     Is Non-Hispanic African American: No     Diabetic: No     Tobacco smoker: No     Systolic Blood Pressure: 108 mmHg     Is BP treated: No     HDL Cholesterol: 73.3 mg/dL     Total Cholesterol: 196 mg/dL    Diet: Generally healthy diet including olive oil, vegetables, and lean protein  Activity: Dog walking Weight lifting   No results found for: "LIPOA"    ROS: See HPI       Studies Reviewed: Aaron Aas   EKG Interpretation Date/Time:  Wednesday April 06 2024 14:01:59 EDT Ventricular Rate:  76 PR Interval:  154 QRS Duration:  86 QT Interval:  374 QTC Calculation: 420 R Axis:   72  Text Interpretation: Normal sinus rhythm Low voltage QRS Cannot rule out Anterior infarct , age undetermined No acute changes from previous tracing Confirmed by Slater Duncan [phone]) on 04/06/2024 2:07:19 PM      Risk Assessment/Calculations:             Physical Exam:   VS: BP 108/76 (BP Location: Left Arm, Patient Position: Sitting, Cuff Size: Normal)   Pulse 80  Ht 5' (1.524 m)   Wt 135 lb 3.2 oz (61.3 kg)   SpO2 97%   BMI 26.40 kg/m   Wt Readings from Last 3 Encounters:  04/06/24 135 lb 3.2 oz (61.3 kg)  01/22/23 161 lb 12.8 oz (73.4 kg)  05/26/22 162 lb 6.4 oz (73.7 kg)     GEN: Well nourished, well developed in no acute distress NECK: No JVD; No carotid bruits CARDIAC: RRR, no murmurs, rubs, gallops RESPIRATORY:  Clear to auscultation without rales, wheezing or rhonchi  ABDOMEN: Soft, non-tender, non-distended EXTREMITIES:  No edema; No deformity     ASSESSMENT AND PLAN: .    Assessment & Plan Coronary artery calcification    CT calcium  score of 46, placing her in the 81st percentile for her age with bulk of calcification in LAD.  She is active and denies chest pain, dyspnea, or other symptoms concerning for angina.  No indication for further ischemia evaluation at this time. Advised that EKG shows normal sinus rhythm, though an anterior infarct cannot be ruled out. She does not recall history of concerning symptoms. Reviewed risk stratification and she will let us  know if she develops concerning cardiac symptoms. ER precautions advised. Focus on secondary prevention including heart healthy mostly plant based diet avoiding saturated fat, processed foods, simple carbohydrates, and sugar along with aiming for at least 150 minutes of moderate intensity exercise each week.   Hyperlipidemia Goal LDL < 70 Lipid panel completed 10/09/2023 with total cholesterol 196, triglycerides 45, HDL 73.3 and LDL 114.  LDL target of 70 mg/dL due to coronary calcification. HDL is favorable at 73 mg/dL. We discussed additional lipid lowering therapy and their respective percent reductions.  She will return for fasting lipid panel and LP(a) testing.  She is agreeable to consider additional lipid-lowering therapy if LDL remains above goal or if LP(a) is elevated.  Heart murmur   She reports history of heart murmur but I do not auscultate a murmur on exam. Advised likely a benign flow murmur. No prior echocardiogram has been conducted. If the murmur is detected again, consider ordering a baseline echocardiogram.  Cardiac Risk Assessment ASCVD risk score is 2.5%.  She has history of hyperlipidemia, but no history of diabetes, hypertension, smoking.  She is already following a healthy diet and exercising regularly.  Advised that lifestyle modification is an important component of risk reduction along with management of cholesterol, blood pressure, blood glucose, and avoiding risky substances.  We will recheck lipid panel and LP(a) for further  restratification and consider additional lipid-lowering therapy for goal LDL < 70.     Plan/Goals: 1: Continue heart healthy diet mostly consisting of whole grains, lean protein (chicken, fish, [place], beans and lentils), vegetables and fruits. Limit saturated fat, sugar, simple carbohydrates, processed and fried foods. 2: Aim for 150 minutes of moderate intensity exercise exercise along with at least 2 days of strength and resistance training each week.        Disposition: 6 months with me  Signed, Slater Duncan, NP-C

## 2024-04-06 ENCOUNTER — Encounter (HOSPITAL_BASED_OUTPATIENT_CLINIC_OR_DEPARTMENT_OTHER): Payer: Self-pay | Admitting: Nurse Practitioner

## 2024-04-06 ENCOUNTER — Ambulatory Visit (HOSPITAL_BASED_OUTPATIENT_CLINIC_OR_DEPARTMENT_OTHER): Payer: Self-pay | Admitting: Nurse Practitioner

## 2024-04-06 VITALS — BP 108/76 | HR 80 | Ht 60.0 in | Wt 135.2 lb

## 2024-04-06 DIAGNOSIS — Z7189 Other specified counseling: Secondary | ICD-10-CM | POA: Diagnosis not present

## 2024-04-06 DIAGNOSIS — I2510 Atherosclerotic heart disease of native coronary artery without angina pectoris: Secondary | ICD-10-CM | POA: Diagnosis not present

## 2024-04-06 DIAGNOSIS — E782 Mixed hyperlipidemia: Secondary | ICD-10-CM

## 2024-04-06 DIAGNOSIS — E785 Hyperlipidemia, unspecified: Secondary | ICD-10-CM | POA: Diagnosis not present

## 2024-04-06 DIAGNOSIS — R011 Cardiac murmur, unspecified: Secondary | ICD-10-CM

## 2024-04-06 NOTE — Patient Instructions (Signed)
 Medication Instructions:   Your physician recommends that you continue on your current medications as directed. Please refer to the Current Medication list given to you today.   *If you need a refill on your cardiac medications before your next appointment, please call your pharmacy*  Lab Work:  Your physician recommends that you return for a FASTING NMR/LPA/ALT fasting after midnight, sometime next week. Paperwork given to patient today.   If you have labs (blood work) drawn today and your tests are completely normal, you will receive your results only by: MyChart Message (if you have MyChart) OR A paper copy in the mail If you have any lab test that is abnormal or we need to change your treatment, we will call you to review the results.  Testing/Procedures:  None ordered.  Follow-Up: At [facility], you and your health needs are our priority.  As part of our continuing mission to provide you with exceptional heart care, our providers are all part of one team.  This team includes your primary Cardiologist (physician) and Advanced Practice Providers or APPs (Physician Assistants and Nurse Practitioners) who all work together to provide you with the care you need, when you need it.  Your next appointment:   6 month(s)  Provider:   Slater Duncan, NP    We recommend signing up for the patient portal called "MyChart".  Sign up information is provided on this After Visit Summary.  MyChart is used to connect with patients for Virtual Visits (Telemedicine).  Patients are able to view lab/test results, encounter notes, upcoming appointments, etc.  Non-urgent messages can be sent to your provider as well.   To learn more about what you can do with MyChart, go to [email or web address].   Other Instructions  Your physician wants you to follow-up in: 6 months.  You will receive a reminder letter in the mail two months in advance. If you don't receive a letter, please call our  office to schedule the follow-up appointment.   Goals: 1: Continue heart healthy diet mostly consisting of whole grains, lean protein (chicken, fish, [place], beans and lentils), vegetables and fruits. Limit saturated fat, sugar, simple carbohydrates, processed and fried foods. 2: Aim for 150 minutes of moderate intensity exercise exercise along with at least 2 days of strength and resistance training each week.

## 2024-04-07 ENCOUNTER — Encounter (HOSPITAL_BASED_OUTPATIENT_CLINIC_OR_DEPARTMENT_OTHER): Payer: Self-pay | Admitting: Nurse Practitioner

## 2024-04-12 DIAGNOSIS — R011 Cardiac murmur, unspecified: Secondary | ICD-10-CM | POA: Diagnosis not present

## 2024-04-12 DIAGNOSIS — E782 Mixed hyperlipidemia: Secondary | ICD-10-CM | POA: Diagnosis not present

## 2024-04-13 LAB — NMR, LIPOPROFILE
Cholesterol, Total: 211 mg/dL — ABNORMAL HIGH (ref 100–199)
HDL Particle Number: 37.4 umol/L (ref >=30.5)
HDL-C: 88 mg/dL (ref >39)
LDL Particle Number: 924 nmol/L (ref <1000)
LDL Size: 21.6 nm (ref >20.5)
LDL-C (NIH Calc): 115 mg/dL — ABNORMAL HIGH (ref 0–99)
LP-IR Score: <25 (ref <=45)
Small LDL Particle Number: <90 nmol/L (ref <=527)
Triglycerides: 41 mg/dL (ref 0–149)

## 2024-04-13 LAB — ALT: ALT: 29 IU/L (ref 0–32)

## 2024-04-13 LAB — LIPOPROTEIN A (LPA): Lipoprotein (a): 258.2 nmol/L — ABNORMAL HIGH (ref <75.0)

## 2024-04-14 ENCOUNTER — Other Ambulatory Visit (HOSPITAL_COMMUNITY): Payer: Self-pay

## 2024-04-14 ENCOUNTER — Telehealth: Payer: Self-pay | Admitting: Pharmacy Technician

## 2024-04-14 ENCOUNTER — Encounter (HOSPITAL_BASED_OUTPATIENT_CLINIC_OR_DEPARTMENT_OTHER): Payer: Self-pay

## 2024-04-14 DIAGNOSIS — E785 Hyperlipidemia, unspecified: Secondary | ICD-10-CM

## 2024-04-14 NOTE — Telephone Encounter (Signed)
 Pharmacy Patient Advocate Encounter   Received notification from STAFF-DANIELLE that prior authorization for REPATHA is required/requested.   Insurance verification completed.   The patient is insured through [facility] .   Per test claim: PA required; PA submitted to above mentioned insurance via CoverMyMeds Key/confirmation #/EOC [id number] Status is pending

## 2024-04-15 ENCOUNTER — Other Ambulatory Visit (HOSPITAL_COMMUNITY): Payer: Self-pay

## 2024-04-15 MED ORDER — REPATHA SURECLICK 140 MG/ML ~~LOC~~ SOAJ: 140.0000 mg | SUBCUTANEOUS | 6 refills | Status: DC

## 2024-04-15 NOTE — Telephone Encounter (Signed)
 Pharmacy Patient Advocate Encounter  Received notification from [facility] that Prior Authorization for repatha has been APPROVED from 04/14/24 to 04/14/25. Ran test claim, Copay is $24.99 with a coupon. This test claim was processed through [facility]- copay amounts may vary at other pharmacies due to pharmacy/plan contracts, or as the patient moves through the different stages of their insurance plan.   [employer], the mfr of REPATHA 140 MG/ML SURECLICK paid 536.80 toward your plan copay. $43.20 out of $580.00 in benefits remaining   I sent the patient a message about the cost in other encounter but no prescription on file for me to call the pharmacy to place order, thank you!

## 2024-04-18 NOTE — Telephone Encounter (Signed)
 LVM for pt to call back to discuss Repatha.

## 2024-04-20 ENCOUNTER — Other Ambulatory Visit (HOSPITAL_BASED_OUTPATIENT_CLINIC_OR_DEPARTMENT_OTHER): Payer: Self-pay | Admitting: *Deleted

## 2024-04-20 DIAGNOSIS — I2510 Atherosclerotic heart disease of native coronary artery without angina pectoris: Secondary | ICD-10-CM

## 2024-04-20 DIAGNOSIS — E785 Hyperlipidemia, unspecified: Secondary | ICD-10-CM

## 2024-04-30 ENCOUNTER — Encounter: Payer: Self-pay | Admitting: Emergency Medicine

## 2024-04-30 ENCOUNTER — Ambulatory Visit
Admission: EM | Admit: 2024-04-30 | Discharge: 2024-04-30 | Disposition: A | Discharge status: Home / Self Care | Attending: Internal Medicine | Admitting: Internal Medicine

## 2024-04-30 DIAGNOSIS — L03311 Cellulitis of abdominal wall: Secondary | ICD-10-CM | POA: Diagnosis not present

## 2024-04-30 MED ORDER — DOXYCYCLINE HYCLATE 100 MG PO CAPS
100.0000 mg | ORAL_CAPSULE | Freq: Two times a day (BID) | ORAL | 0 refills | Status: AC
Start: 2024-04-30 — End: 2024-05-07

## 2024-04-30 NOTE — ED Triage Notes (Signed)
 Pt presents with possible tick or insect bite on stomach. States she noticed it Thursday and scratched of what she thought was a scab.

## 2024-04-30 NOTE — ED Provider Notes (Signed)
 [facility]    CSN: [id number] Arrival date & time: 04/30/24  0907      History   Chief Complaint Chief Complaint  Patient presents with   Insect Bite    HPI Wendy Shaw is a 63 y.o. female.   Wendy Shaw is a 63 y.o. female presenting for chief complaint of possible insect bite to the abdomen that she first noticed 2 days ago. She became itchy to the abdomen near the umbilicus, scratched, and then noticed she may have scratched off a scab. There is now redness surrounding the wound. She has not noticed any swelling, pain, pus, or recent  fevers, chills, nausea, vomiting, or diarrhea. She is unsure if she was bit by a tick or another insect. She denies recent antibiotic/steroid use and has not attempted use of any OTC medications to help with symptoms PTA.      Past Medical History:  Diagnosis Date   Amenorrhea    secondary to menopause   Chest pressure    Family history of breast cancer    Family history of heart disease    Family history of ovarian cancer    Heart murmur    Hyperlipidemia    Osteopenia    Post-menopausal    Systolic ejection murmur    Tachycardia     Patient Active Problem List   Diagnosis Date Noted   Osteopenia 10/21/2021   Family history of breast cancer 10/18/2019   Family history of neoplasm of ovary 10/18/2019   Hyperlipidemia 03/03/2013   Heart murmur     Past Surgical History:  Procedure Laterality Date   CESAREAN SECTION  2005    OB History   No obstetric history on file.      Home Medications    Prior to Admission medications   Medication Sig Start Date End Date Taking? Authorizing Provider  doxycycline (VIBRAMYCIN) 100 MG capsule Take 1 capsule (100 mg total) by mouth 2 (two) times daily for 7 days. 04/30/24 05/07/24 Yes StanhopeDanny Dye, FNP  CALCIUM -VITAMIN D PO Take 1,000 mg by mouth daily. 2 tab daily,Calcium , magnesium & Zinc    [provider]  Evolocumab  (REPATHA  SURECLICK) 140 MG/ML  SOAJ Inject 140 mg into the skin every 14 (fourteen) days. 04/15/24   Swinyer, Leilani Punter, NP  Multiple Vitamin (MULTIVITAMIN) capsule Take 1 capsule by mouth daily.    [provider]  Omega-3 Fatty Acids (FISH OIL PO) Take 1,000 mg by mouth daily at 2 PM. 2 tab daily    [provider]  rosuvastatin  (CRESTOR ) 20 MG tablet Take 1 tablet (20 mg total) by mouth daily. 12/18/23   Nche, Connye Delaine, NP  Specialty Vitamins Products (BIOTIN PLUS KERATIN PO) Take 100 mg by mouth daily at 2 PM.     [provider]    Family History Family History  Problem Relation Age of Onset   Hyperlipidemia Mother    Arthritis Mother    Cancer Father        prostate cancer, 54   Arthritis Father    Hypertension Father    Heart attack Father    Hyperlipidemia Sister    Breast cancer Sister        2   Ovarian cancer Maternal Aunt        33   Cancer Maternal Grandfather        lung cancer    Social History Social History   Tobacco Use   Smoking status: Never  Smokeless tobacco: Never  Vaping Use   Vaping status: Never Used  Substance Use Topics   Alcohol use: Yes    Comment: social   Drug use: No     Allergies   Pollen extract   Review of Systems Review of Systems Per HPI  Physical Exam Triage Vital Signs ED Triage Vitals [04/30/24 0911]  Encounter Vitals Group     BP 132/81     Systolic BP Percentile      Diastolic BP Percentile      Pulse Rate 69     Resp 16     Temp 98.1 F (36.7 C)     Temp Source Oral     SpO2 96 %     Weight      Height      Head Circumference      Peak Flow      Pain Score      Pain Loc      Pain Education      Exclude from Growth Chart    No data found.  Updated Vital Signs BP 132/81 (BP Location: Right Arm)   Pulse 69   Temp 98.1 F (36.7 C) (Oral)   Resp 16   SpO2 96%   Visual Acuity Right Eye Distance:   Left Eye Distance:   Bilateral Distance:    Right Eye Near:   Left Eye Near:    Bilateral  Near:     Physical Exam Vitals and nursing note reviewed.  Constitutional:      Appearance: She is not ill-appearing or toxic-appearing.  HENT:     Head: Normocephalic and atraumatic.     Right Ear: Hearing and external ear normal.     Left Ear: Hearing and external ear normal.     Nose: Nose normal.     Mouth/Throat:     Lips: Pink.  Eyes:     General: Lids are normal. Vision grossly intact. Gaze aligned appropriately.     Extraocular Movements: Extraocular movements intact.     Conjunctiva/sclera: Conjunctivae normal.  Pulmonary:     Effort: Pulmonary effort is normal.  Musculoskeletal:     Cervical back: Neck supple.  Skin:    General: Skin is warm and dry.     Capillary Refill: Capillary refill takes less than 2 seconds.     Findings: Lesion present. No rash.       Neurological:     General: No focal deficit present.     Mental Status: She is alert and oriented to person, place, and time. Mental status is at baseline.     Cranial Nerves: No dysarthria or facial asymmetry.  Psychiatric:        Mood and Affect: Mood normal.        Speech: Speech normal.        Behavior: Behavior normal.        Thought Content: Thought content normal.        Judgment: Judgment normal.      UC Treatments / Results  Labs (all labs ordered are listed, but only abnormal results are displayed) Labs Reviewed - No data to display  EKG   Radiology No results found.  Procedures Procedures (including critical care time)  Medications Ordered in UC Medications - No data to display  Initial Impression / Assessment and Plan / UC Course  I have reviewed the triage vital signs and the nursing notes.  Pertinent labs & imaging results that were available during my  care of the patient were reviewed by me and considered in my medical decision making (see chart for details).   1. Cellulitis of abdominal wall Cellulitis of the abdominal wall will be treated with doxycycline to cover for  tick borne illness given she's unsure if there was a tick present prior to scratching scab.  Skin pen used to outline erythema, advised to return to urgent care for reassessment should redness spread significantly in the next 24-48 hours.  Infection return precautions discussed.   Counseled patient on potential for adverse effects with medications prescribed/recommended today, strict ER and return-to-clinic precautions discussed, patient verbalized understanding.    Final Clinical Impressions(s) / UC Diagnoses   Final diagnoses:  Cellulitis of abdominal wall     Discharge Instructions      You have cellulitis which is a superficial skin infection.  Take antibiotic every 12 hours with a snack/food for the next 7 days. Watch for worsening signs of infection to the lesion such as spreading redness, swelling, pus drainage, pain, fever/chills. I have drawn a circle around the area of redness with a skin pen today, if the redness grows past the circle significantly in the next 24-48 hours, please return to urgent care for reevaluation.  Return if you develop fever, chills, nausea, vomiting, etc as well.  If you develop any new or worsening symptoms or if your symptoms do not start to improve, please return here or follow-up with your primary care provider. If your symptoms are severe, please go to the emergency room.  ED Prescriptions     Medication Sig Dispense Auth. Provider   doxycycline (VIBRAMYCIN) 100 MG capsule Take 1 capsule (100 mg total) by mouth 2 (two) times daily for 7 days. 14 capsule Starlene Eaton, FNP      PDMP not reviewed this encounter.   Shella Devoid [place], [place] 04/30/24 [phone]

## 2024-04-30 NOTE — Discharge Instructions (Signed)
 You have cellulitis which is a superficial skin infection.  Take antibiotic every 12 hours with a snack/food for the next 7 days. Watch for worsening signs of infection to the lesion such as spreading redness, swelling, pus drainage, pain, fever/chills. I have drawn a circle around the area of redness with a skin pen today, if the redness grows past the circle significantly in the next 24-48 hours, please return to urgent care for reevaluation.  Return if you develop fever, chills, nausea, vomiting, etc as well.  If you develop any new or worsening symptoms or if your symptoms do not start to improve, please return here or follow-up with your primary care provider. If your symptoms are severe, please go to the emergency room.

## 2024-05-17 ENCOUNTER — Telehealth (HOSPITAL_BASED_OUTPATIENT_CLINIC_OR_DEPARTMENT_OTHER): Payer: Self-pay | Admitting: *Deleted

## 2024-05-17 NOTE — Telephone Encounter (Signed)
 Lvm regarding the cost of pt's repatha . Will be in touch on Thursday.

## 2024-05-19 ENCOUNTER — Other Ambulatory Visit (HOSPITAL_BASED_OUTPATIENT_CLINIC_OR_DEPARTMENT_OTHER): Payer: Self-pay

## 2024-05-19 DIAGNOSIS — Z1231 Encounter for screening mammogram for malignant neoplasm of breast: Secondary | ICD-10-CM | POA: Diagnosis not present

## 2024-05-19 NOTE — Telephone Encounter (Signed)
 Lvm for pt about cost of Rep[facility]  at [facility]. Reached out to [facility] pharmacy and the pharmacist could not do a test price due to pt refilling at [facility]. The pt will have to wait till Repatha  comes up for a refill.

## 2024-06-09 DIAGNOSIS — I2510 Atherosclerotic heart disease of native coronary artery without angina pectoris: Secondary | ICD-10-CM | POA: Diagnosis not present

## 2024-06-09 DIAGNOSIS — E785 Hyperlipidemia, unspecified: Secondary | ICD-10-CM | POA: Diagnosis not present

## 2024-06-10 LAB — NMR, LIPOPROFILE
Cholesterol, Total: 159 mg/dL (ref 100–199)
HDL Particle Number: 40.8 umol/L (ref >=30.5)
HDL-C: 91 mg/dL (ref >39)
LDL Particle Number: 381 nmol/L (ref <1000)
LDL Size: 21 nm (ref >20.5)
LDL-C (NIH Calc): 58 mg/dL (ref 0–99)
LP-IR Score: <25 (ref <=45)
Small LDL Particle Number: <90 nmol/L (ref <=527)
Triglycerides: 43 mg/dL (ref 0–149)

## 2024-06-13 ENCOUNTER — Ambulatory Visit: Payer: Self-pay | Admitting: Nurse Practitioner

## 2024-07-20 ENCOUNTER — Other Ambulatory Visit: Payer: Self-pay | Admitting: Nurse Practitioner

## 2024-08-30 DIAGNOSIS — Z6832 Body mass index (BMI) 32.0-32.9, adult: Secondary | ICD-10-CM | POA: Diagnosis not present

## 2024-08-30 DIAGNOSIS — Z1151 Encounter for screening for human papillomavirus (HPV): Secondary | ICD-10-CM | POA: Diagnosis not present

## 2024-08-30 DIAGNOSIS — Z01419 Encounter for gynecological examination (general) (routine) without abnormal findings: Secondary | ICD-10-CM | POA: Diagnosis not present

## 2024-08-30 DIAGNOSIS — Z124 Encounter for screening for malignant neoplasm of cervix: Secondary | ICD-10-CM | POA: Diagnosis not present

## 2024-09-22 ENCOUNTER — Other Ambulatory Visit (HOSPITAL_BASED_OUTPATIENT_CLINIC_OR_DEPARTMENT_OTHER): Payer: Self-pay | Admitting: Nurse Practitioner

## 2024-09-22 DIAGNOSIS — E785 Hyperlipidemia, unspecified: Secondary | ICD-10-CM

## 2024-10-03 ENCOUNTER — Encounter (HOSPITAL_BASED_OUTPATIENT_CLINIC_OR_DEPARTMENT_OTHER): Payer: Self-pay

## 2024-10-03 DIAGNOSIS — E785 Hyperlipidemia, unspecified: Secondary | ICD-10-CM

## 2024-10-11 MED ORDER — REPATHA SURECLICK 140 MG/ML ~~LOC~~ SOAJ: 140.0000 mg | SUBCUTANEOUS | 2 refills | Status: DC

## 2024-10-21 DIAGNOSIS — Z13820 Encounter for screening for osteoporosis: Secondary | ICD-10-CM | POA: Diagnosis not present

## 2025-01-10 ENCOUNTER — Other Ambulatory Visit (HOSPITAL_BASED_OUTPATIENT_CLINIC_OR_DEPARTMENT_OTHER): Payer: Self-pay | Admitting: Nurse Practitioner

## 2025-01-10 DIAGNOSIS — E785 Hyperlipidemia, unspecified: Secondary | ICD-10-CM
